# Patient Record
Sex: Female | Born: 1995 | Hispanic: No | Marital: Married | Smoking: Current every day smoker
Health system: Southern US, Community
[De-identification: ages and names within clinical notes are randomized; demographics above are authoritative.]

## PROBLEM LIST (undated history)

## (undated) DIAGNOSIS — G43909 Migraine, unspecified, not intractable, without status migrainosus: Secondary | ICD-10-CM

---

## 2021-02-26 ENCOUNTER — Inpatient Hospital Stay (HOSPITAL_COMMUNITY)
Admission: AD | Admit: 2021-02-26 | Discharge: 2021-02-26 | Disposition: A | Discharge status: Home / Self Care | Payer: Medicaid Other | Attending: Obstetrics & Gynecology | Admitting: Obstetrics & Gynecology

## 2021-02-26 ENCOUNTER — Other Ambulatory Visit: Payer: Self-pay

## 2021-02-26 ENCOUNTER — Encounter (HOSPITAL_COMMUNITY): Payer: Self-pay | Admitting: Obstetrics & Gynecology

## 2021-02-26 ENCOUNTER — Inpatient Hospital Stay (HOSPITAL_BASED_OUTPATIENT_CLINIC_OR_DEPARTMENT_OTHER): Payer: Medicaid Other

## 2021-02-26 DIAGNOSIS — O321XX0 Maternal care for breech presentation, not applicable or unspecified: Secondary | ICD-10-CM

## 2021-02-26 DIAGNOSIS — O99332 Smoking (tobacco) complicating pregnancy, second trimester: Secondary | ICD-10-CM | POA: Diagnosis not present

## 2021-02-26 DIAGNOSIS — R102 Pelvic and perineal pain: Secondary | ICD-10-CM | POA: Insufficient documentation

## 2021-02-26 DIAGNOSIS — Z3A17 17 weeks gestation of pregnancy: Secondary | ICD-10-CM | POA: Insufficient documentation

## 2021-02-26 DIAGNOSIS — N949 Unspecified condition associated with female genital organs and menstrual cycle: Secondary | ICD-10-CM

## 2021-02-26 DIAGNOSIS — O26892 Other specified pregnancy related conditions, second trimester: Secondary | ICD-10-CM | POA: Insufficient documentation

## 2021-02-26 LAB — WET PREP, GENITAL
Clue Cells Wet Prep HPF POC: NONE SEEN
Sperm: NONE SEEN
Trich, Wet Prep: NONE SEEN
Yeast Wet Prep HPF POC: NONE SEEN

## 2021-02-26 LAB — URINALYSIS, ROUTINE W REFLEX MICROSCOPIC
Bilirubin Urine: NEGATIVE
Glucose, UA: NEGATIVE mg/dL
Hgb urine dipstick: NEGATIVE
Ketones, ur: NEGATIVE mg/dL
Leukocytes,Ua: NEGATIVE
Nitrite: NEGATIVE
Protein, ur: NEGATIVE mg/dL
Specific Gravity, Urine: <1.005 — ABNORMAL LOW (ref 1.005–1.030)
pH: 7 (ref 5.0–8.0)

## 2021-02-26 NOTE — MAU Note (Signed)
Pt. Self swabbed, cultures sent to lab

## 2021-02-26 NOTE — MAU Note (Signed)
Presents stating she's [redacted] weeks pregnant arrived to [place] 2 days ago from [place] and since arrival has lower abdominal pain and vaginal pressure.  Denies VB.

## 2021-02-26 NOTE — MAU Provider Note (Signed)
History     CSN: [id number]  Arrival date and time: 02/26/21 1009   Event Date/Time   First Provider Initiated Contact with Patient 02/26/21 1338      Chief Complaint  Patient presents with   Abdominal Pain   Vaginal Pressure   25 y.o. G3P1011 @17 .0 wks presenting with pelvic and vaginal pain. Reports onset 2-3 days ago. Pain is worse when she is standing or sitting, gets better when lying down. Rates pain 8/10. Has not tried anything for it. Denies urinary sx. Denies vaginal itching and malodor but reports increased discharge. Denies VB. Also reports diarrhea and nausea since yesterday. She was getting care in 10/10 and moved here 3 days ago. Reports VB in first trimester, denies other complications.   OB History     Gravida  3   Para  1   Term  1   Preterm  0   AB  1   Living  1      SAB  1   IAB  0   Ectopic  0   Multiple  0   Live Births  1           No past medical history on file.  Past Surgical History:  Procedure Laterality Date   CESAREAN SECTION      History reviewed. No pertinent family history.  Social History   Tobacco Use   Smoking status: Some Days    Pack years: 0.00    Types: Cigarettes   Smokeless tobacco: Never  Substance Use Topics   Alcohol use: Never   Drug use: Never    Allergies: No Known Allergies  Medications Prior to Admission  Medication Sig Dispense Refill Last Dose   acetaminophen (TYLENOL) 500 MG tablet Take 1,000 mg by mouth every 6 (six) hours as needed.   02/26/2021 at 1300   Prenatal Vit-Fe Fumarate-FA (PRENATAL VITAMINS PO) Take by mouth.       Review of Systems  Gastrointestinal:  Positive for diarrhea and nausea. Negative for vomiting.  Genitourinary:  Positive for pelvic pain, vaginal discharge and vaginal pain. Negative for dysuria, frequency, urgency and vaginal bleeding.  Physical Exam   Blood pressure 107/74, pulse 79, temperature 98.3 F (36.8 C), temperature source Oral, resp. rate 18,  height 5' (1.524 m), weight 66.7 kg, last menstrual period 11/04/2020, SpO2 99 %.  Physical Exam Vitals and nursing note reviewed. Exam conducted with a chaperone present.  Constitutional:      General: She is not in acute distress.    Appearance: Normal appearance.  HENT:     Head: Normocephalic and atraumatic.  Cardiovascular:     Rate and Rhythm: Normal rate.  Pulmonary:     Effort: Pulmonary effort is normal. No respiratory distress.  Abdominal:     General: There is no distension.     Palpations: Abdomen is soft. There is no mass.     Tenderness: There is no abdominal tenderness. There is no guarding or rebound.     Hernia: No hernia is present.  Genitourinary:    Comments: VE: closed/thick Musculoskeletal:        General: Normal range of motion.     Cervical back: Normal range of motion.  Skin:    General: Skin is warm and dry.  Neurological:     General: No focal deficit present.     Mental Status: She is alert and oriented to person, place, and time.  Psychiatric:  Mood and Affect: Mood normal.        Behavior: Behavior normal.  FHT 141  Results for orders placed or performed during the hospital encounter of 02/26/21 (from the past 24 hour(s))  Urinalysis, Routine w reflex microscopic Urine, Clean Catch     Status: Abnormal   Collection Time: 02/26/21 11:14 AM  Result Value Ref Range   Color, Urine YELLOW YELLOW   APPearance CLEAR CLEAR   Specific Gravity, Urine <1.005 (L) 1.005 - 1.030   pH 7.0 5.0 - 8.0   Glucose, UA NEGATIVE NEGATIVE mg/dL   Hgb urine dipstick NEGATIVE NEGATIVE   Bilirubin Urine NEGATIVE NEGATIVE   Ketones, ur NEGATIVE NEGATIVE mg/dL   Protein, ur NEGATIVE NEGATIVE mg/dL   Nitrite NEGATIVE NEGATIVE   Leukocytes,Ua NEGATIVE NEGATIVE  Wet prep, genital     Status: Abnormal   Collection Time: 02/26/21 12:17 PM   Specimen: PATH Cytology Cervicovaginal Ancillary Only  Result Value Ref Range   Yeast Wet Prep HPF POC NONE SEEN NONE SEEN    Trich, Wet Prep NONE SEEN NONE SEEN   Clue Cells Wet Prep HPF POC NONE SEEN NONE SEEN   WBC, Wet Prep HPF POC MANY (A) NONE SEEN   Sperm NONE SEEN    MAU Course  Procedures  MDM Labs and [place] ordered and reviewed. Normal [place] including CL. Pain likely physiologic to pregnancy, discussed comfort measures. Stable for discharge home.   Assessment and Plan   1. [redacted] weeks gestation of pregnancy   2. Pelvic pain affecting pregnancy in second trimester, antepartum   3. Round ligament pain    Discharge home Follow up with OB provider of choice to start care- list provided SAB precautions  Allergies as of 02/26/2021   No Known Allergies      Medication List     TAKE these medications    acetaminophen 500 MG tablet Commonly known as: TYLENOL Take 1,000 mg by mouth every 6 (six) hours as needed.   PRENATAL VITAMINS PO Take by mouth.        Donette Larry, CNM 02/26/2021, 3:46 PM

## 2021-02-27 LAB — GC/CHLAMYDIA PROBE AMP (~~LOC~~) NOT AT ARMC
Chlamydia: NEGATIVE
Comment: NEGATIVE
Comment: NORMAL
Neisseria Gonorrhea: NEGATIVE

## 2021-03-20 ENCOUNTER — Encounter (HOSPITAL_COMMUNITY): Payer: Self-pay | Admitting: Obstetrics & Gynecology

## 2021-03-20 ENCOUNTER — Inpatient Hospital Stay (HOSPITAL_COMMUNITY)
Admission: AD | Admit: 2021-03-20 | Discharge: 2021-03-20 | Disposition: A | Discharge status: Home / Self Care | Payer: Medicaid Other | Attending: Obstetrics & Gynecology | Admitting: Obstetrics & Gynecology

## 2021-03-20 DIAGNOSIS — O219 Vomiting of pregnancy, unspecified: Secondary | ICD-10-CM | POA: Insufficient documentation

## 2021-03-20 DIAGNOSIS — O99282 Endocrine, nutritional and metabolic diseases complicating pregnancy, second trimester: Secondary | ICD-10-CM | POA: Diagnosis not present

## 2021-03-20 DIAGNOSIS — O99612 Diseases of the digestive system complicating pregnancy, second trimester: Secondary | ICD-10-CM | POA: Diagnosis not present

## 2021-03-20 DIAGNOSIS — F17210 Nicotine dependence, cigarettes, uncomplicated: Secondary | ICD-10-CM | POA: Insufficient documentation

## 2021-03-20 DIAGNOSIS — O99891 Other specified diseases and conditions complicating pregnancy: Secondary | ICD-10-CM

## 2021-03-20 DIAGNOSIS — R109 Unspecified abdominal pain: Secondary | ICD-10-CM | POA: Diagnosis not present

## 2021-03-20 DIAGNOSIS — K219 Gastro-esophageal reflux disease without esophagitis: Secondary | ICD-10-CM

## 2021-03-20 DIAGNOSIS — O99332 Smoking (tobacco) complicating pregnancy, second trimester: Secondary | ICD-10-CM | POA: Diagnosis not present

## 2021-03-20 DIAGNOSIS — R197 Diarrhea, unspecified: Secondary | ICD-10-CM | POA: Diagnosis not present

## 2021-03-20 DIAGNOSIS — E739 Lactose intolerance, unspecified: Secondary | ICD-10-CM | POA: Insufficient documentation

## 2021-03-20 DIAGNOSIS — Z3A20 20 weeks gestation of pregnancy: Secondary | ICD-10-CM | POA: Diagnosis not present

## 2021-03-20 DIAGNOSIS — O26892 Other specified pregnancy related conditions, second trimester: Secondary | ICD-10-CM | POA: Insufficient documentation

## 2021-03-20 DIAGNOSIS — R519 Headache, unspecified: Secondary | ICD-10-CM | POA: Diagnosis not present

## 2021-03-20 DIAGNOSIS — O26899 Other specified pregnancy related conditions, unspecified trimester: Secondary | ICD-10-CM

## 2021-03-20 LAB — URINALYSIS, ROUTINE W REFLEX MICROSCOPIC
Bilirubin Urine: NEGATIVE
Glucose, UA: NEGATIVE mg/dL
Hgb urine dipstick: NEGATIVE
Ketones, ur: NEGATIVE mg/dL
Leukocytes,Ua: NEGATIVE
Nitrite: NEGATIVE
Protein, ur: NEGATIVE mg/dL
Specific Gravity, Urine: 1.014 (ref 1.005–1.030)
pH: 7 (ref 5.0–8.0)

## 2021-03-20 MED ORDER — METOCLOPRAMIDE HCL 5 MG/ML IJ SOLN
10.0000 mg | Freq: Once | INTRAMUSCULAR | Status: AC
  Administered 2021-03-20: 10 mg via INTRAVENOUS
  Filled 2021-03-20: qty 2

## 2021-03-20 MED ORDER — LOPERAMIDE HCL 2 MG PO CAPS
2.0000 mg | ORAL_CAPSULE | Freq: Once | ORAL | Status: AC
  Administered 2021-03-20: 2 mg via ORAL
  Filled 2021-03-20: qty 1

## 2021-03-20 MED ORDER — ALUM & MAG HYDROXIDE-SIMETH 200-200-20 MG/5ML PO SUSP
30.0000 mL | Freq: Once | ORAL | Status: AC
  Administered 2021-03-20: 30 mL via ORAL
  Filled 2021-03-20: qty 30

## 2021-03-20 MED ORDER — DEXAMETHASONE SODIUM PHOSPHATE 10 MG/ML IJ SOLN
10.0000 mg | Freq: Once | INTRAMUSCULAR | Status: AC
  Administered 2021-03-20: 10 mg via INTRAVENOUS
  Filled 2021-03-20: qty 1

## 2021-03-20 MED ORDER — CALCIUM CARBONATE ANTACID 500 MG PO CHEW: 1.0000 | CHEWABLE_TABLET | Freq: Two times a day (BID) | ORAL | 0 refills | Status: AC

## 2021-03-20 MED ORDER — OMEPRAZOLE MAGNESIUM 20 MG PO TBEC: 20.0000 mg | DELAYED_RELEASE_TABLET | Freq: Every day | ORAL | 0 refills | Status: DC

## 2021-03-20 MED ORDER — DIPHENHYDRAMINE HCL 50 MG/ML IJ SOLN
25.0000 mg | Freq: Once | INTRAMUSCULAR | Status: AC
  Administered 2021-03-20: 25 mg via INTRAVENOUS
  Filled 2021-03-20: qty 1

## 2021-03-20 NOTE — MAU Note (Signed)
Pt reports she has had N/V/D and diarrhea that started last week has the N/VD for a day then it goes away for a few days and then it comes back and last for a full day and then goes away again .  N/V/D started again last night   Reports stomach ache and headache with the N/V/D. Took tylenol for headache and it has not helped.c/o reflux as well.

## 2021-03-20 NOTE — MAU Provider Note (Addendum)
History     CSN: [id number]  Arrival date and time: 03/20/21 1500   None     Chief Complaint  Patient presents with   Emesis   HPI  Kerra Jastrzebski is a 25 year old female G3P1011 with chief complaints of nausea, vomiting, and diarrhea. These are recurrent problems,  that she has had intermittently over the past 2 weeks. Reports daily heart burn and headache for the past week. Diarrhea 4-5 times in the last 24 hours. Patient states diarrhea episodes start with abdominal pain and urgency to defecate and end with rectal burning. Denies blood in stool. Last vomiting episode today at 5am and every 4-3 hours prior for since yesterday. States she has been able to keep down yogurt and some coffee. She states she thinks this is induced by heartburn and she describes her emesis as a small amount. She has been eating one meal per day for the past week due to heartburn. Tried taking famotidine 20mg  at time of heartburn onset and sleeping upright to try to relieve heartburn, both of these have been unsuccessful. Reports normal fetal movement. Denies chest pain, shortness of breath, vaginal bleeding/discharge, and uterine contractions.   Headache can be described as constant, is localized to the frontal and occipital lobes, and she currently rates pain 10/10. Took tylenol at 1pm today with no relief of pain. Sister had to drive patient to MAU today due to head pain. Denies vision changes. Reports history of migraine headaches in last pregnancy and was treated with "migraine pills" when she lived in 12/10.   Allergies to medicines: none Medications currently taking: prenatal vitamins, famotidine 20mg  (only at onset of heartburn), tylenol 1000mg  prn  Prenatal care: states she has a new OB appointment scheduled for April 09, 2021 [facility] [place]   OB History     Gravida  3   Para  1   Term  1   Preterm  0   AB  1   Living  1      SAB  1   IAB  0   Ectopic   0   Multiple  0   Live Births  1           No past medical history on file.  Past Surgical History:  Procedure Laterality Date   CESAREAN SECTION      No family history on file.  Social History   Tobacco Use   Smoking status: Some Days    Types: Cigarettes   Smokeless tobacco: Never  Substance Use Topics   Alcohol use: Never   Drug use: Never    Allergies: No Known Allergies  Medications Prior to Admission  Medication Sig Dispense Refill Last Dose   acetaminophen (TYLENOL) 500 MG tablet Take 1,000 mg by mouth every 6 (six) hours as needed.      Prenatal Vit-Fe Fumarate-FA (PRENATAL VITAMINS PO) Take by mouth.       Review of Systems  Constitutional:  Negative for fatigue and fever.  Respiratory:  Negative for chest tightness and shortness of breath.   Cardiovascular:  Negative for chest pain and palpitations.  Gastrointestinal:  Positive for abdominal pain, diarrhea, nausea and vomiting. Negative for blood in stool.       Reports heartburn   Genitourinary:  Negative for dysuria, urgency, vaginal bleeding and vaginal discharge.  Musculoskeletal:  Negative for back pain.  Neurological:  Negative for weakness.  Physical Exam   Blood pressure 117/70,  pulse 98, temperature 98.4 F (36.9 C), resp. rate 18, height 5' (1.524 m), weight 67.6 kg, last menstrual period 11/04/2020.  Physical Exam Vitals and nursing note reviewed.  Constitutional:      Appearance: She is normal weight.  Eyes:     Extraocular Movements: Extraocular movements intact.     Pupils: Pupils are equal, round, and reactive to light.  Cardiovascular:     Rate and Rhythm: Normal rate.  Pulmonary:     Effort: Pulmonary effort is normal.  Abdominal:     Palpations: Abdomen is soft.     Tenderness: There is abdominal tenderness in the epigastric area and periumbilical area.  Skin:    General: Skin is warm and dry.  Neurological:     Mental Status: She is alert and oriented to person,  place, and time.     Cranial Nerves: No dysarthria or facial asymmetry.     Sensory: Sensation is intact.     Motor: Motor function is intact. No weakness or pronator drift.     Coordination: Coordination is intact.  Psychiatric:        Mood and Affect: Mood normal.        Behavior: Behavior normal.    MAU Course  Procedures Results for orders placed or performed during the hospital encounter of 03/20/21 (from the past 24 hour(s))  Urinalysis, Routine w reflex microscopic Urine, Clean Catch     Status: None   Collection Time: 03/20/21  3:41 PM  Result Value Ref Range   Color, Urine YELLOW YELLOW   APPearance CLEAR CLEAR   Specific Gravity, Urine 1.014 1.005 - 1.030   pH 7.0 5.0 - 8.0   Glucose, UA NEGATIVE NEGATIVE mg/dL   Hgb urine dipstick NEGATIVE NEGATIVE   Bilirubin Urine NEGATIVE NEGATIVE   Ketones, ur NEGATIVE NEGATIVE mg/dL   Protein, ur NEGATIVE NEGATIVE mg/dL   Nitrite NEGATIVE NEGATIVE   Leukocytes,Ua NEGATIVE NEGATIVE   MDM Heartburn- GI cocktail and IV Pepcid Provided patient education on proper use of omeprazole 20mg  medication. Instructed patient to take 1 tablet 30-60 minutes prior to food intake in the morning once daily. Omeprazole is most effective when used daily and not as needed. Take TUMS prescription for relief of heartburn as needed every 4 hours or prior to meals. Instructed to avoid aggravating foods such as spicy foods, tomatoes, citrus fruits, chocolate, and caffeine.  Meds ordered this encounter  Medications   metoCLOPramide (REGLAN) injection 10 mg   diphenhydrAMINE (BENADRYL) injection 25 mg   dexamethasone (DECADRON) injection 10 mg   alum & mag hydroxide-simeth (MAALOX/MYLANTA) 200-200-20 MG/5ML suspension 30 mL   loperamide (IMODIUM) capsule 2 mg   omeprazole (PRILOSEC OTC) 20 MG tablet    Sig: Take 1 tablet (20 mg total) by mouth daily.    Dispense:  30 tablet    Refill:  0    Order Specific Question:   Supervising Provider    Answer:    A [[id number]]   calcium carbonate (TUMS) 500 MG chewable tablet    Sig: Chew 1 tablet (200 mg of elemental calcium total) by mouth 2 (two) times daily.    Dispense:  60 tablet    Refill:  0    Order Specific Question:   Supervising Provider    Answer:   Mariel Aloe [[id number]]    Assessment and Plan  --25 y.o. G3P1011 at [redacted]w[redacted]d  --FHT 147 by Doppler --HA resolved with IV cocktail --GERD and lactose intolerance in setting  of hx of lactose intolerance --Patient denies pain, other complaints prior to discharge --Discharge home in stable condition  Clayton Bibles, MSA, MSN, CNM Certified Nurse Midwife, Biochemist, clinical for [employer], [facility]

## 2021-03-27 ENCOUNTER — Inpatient Hospital Stay (HOSPITAL_COMMUNITY)
Admission: AD | Admit: 2021-03-27 | Discharge: 2021-03-28 | Disposition: A | Discharge status: Home / Self Care | Payer: Medicaid Other | Attending: Family Medicine | Admitting: Family Medicine

## 2021-03-27 ENCOUNTER — Encounter (HOSPITAL_COMMUNITY): Payer: Self-pay | Admitting: Family Medicine

## 2021-03-27 ENCOUNTER — Other Ambulatory Visit: Payer: Self-pay

## 2021-03-27 DIAGNOSIS — J069 Acute upper respiratory infection, unspecified: Secondary | ICD-10-CM | POA: Insufficient documentation

## 2021-03-27 DIAGNOSIS — Z20822 Contact with and (suspected) exposure to covid-19: Secondary | ICD-10-CM | POA: Insufficient documentation

## 2021-03-27 DIAGNOSIS — O99352 Diseases of the nervous system complicating pregnancy, second trimester: Secondary | ICD-10-CM | POA: Diagnosis not present

## 2021-03-27 DIAGNOSIS — O98512 Other viral diseases complicating pregnancy, second trimester: Secondary | ICD-10-CM | POA: Insufficient documentation

## 2021-03-27 DIAGNOSIS — Z3A21 21 weeks gestation of pregnancy: Secondary | ICD-10-CM | POA: Diagnosis not present

## 2021-03-27 DIAGNOSIS — G43909 Migraine, unspecified, not intractable, without status migrainosus: Secondary | ICD-10-CM | POA: Diagnosis not present

## 2021-03-27 DIAGNOSIS — Z79899 Other long term (current) drug therapy: Secondary | ICD-10-CM | POA: Diagnosis not present

## 2021-03-27 DIAGNOSIS — Z87891 Personal history of nicotine dependence: Secondary | ICD-10-CM | POA: Insufficient documentation

## 2021-03-27 DIAGNOSIS — Z0184 Encounter for antibody response examination: Secondary | ICD-10-CM

## 2021-03-27 DIAGNOSIS — O99512 Diseases of the respiratory system complicating pregnancy, second trimester: Secondary | ICD-10-CM | POA: Diagnosis not present

## 2021-03-27 HISTORY — DX: Migraine, unspecified, not intractable, without status migrainosus: G43.909

## 2021-03-27 NOTE — MAU Note (Signed)
Pt reports 2 days ago started w/ severe HA and body aches, feverish, and SOB. Took multiple doses of Tylenol and id not relieve pain. Denies VB and LOF and CTX.

## 2021-03-28 ENCOUNTER — Inpatient Hospital Stay (HOSPITAL_COMMUNITY): Payer: Medicaid Other

## 2021-03-28 DIAGNOSIS — J069 Acute upper respiratory infection, unspecified: Secondary | ICD-10-CM

## 2021-03-28 DIAGNOSIS — Z3A21 21 weeks gestation of pregnancy: Secondary | ICD-10-CM

## 2021-03-28 DIAGNOSIS — O99512 Diseases of the respiratory system complicating pregnancy, second trimester: Secondary | ICD-10-CM

## 2021-03-28 DIAGNOSIS — O99352 Diseases of the nervous system complicating pregnancy, second trimester: Secondary | ICD-10-CM | POA: Diagnosis not present

## 2021-03-28 DIAGNOSIS — G43909 Migraine, unspecified, not intractable, without status migrainosus: Secondary | ICD-10-CM

## 2021-03-28 LAB — RESP PANEL BY RT-PCR (FLU A&B, COVID) ARPGX2
Influenza A by PCR: NEGATIVE
Influenza B by PCR: NEGATIVE
SARS Coronavirus 2 by RT PCR: NEGATIVE

## 2021-03-28 MED ORDER — PSEUDOEPHEDRINE HCL 30 MG PO TABS
60.0000 mg | ORAL_TABLET | Freq: Once | ORAL | Status: AC
  Administered 2021-03-28: 60 mg via ORAL
  Filled 2021-03-28: qty 2

## 2021-03-28 MED ORDER — METOCLOPRAMIDE HCL 5 MG/ML IJ SOLN
10.0000 mg | Freq: Once | INTRAMUSCULAR | Status: AC
  Administered 2021-03-28: 10 mg via INTRAVENOUS
  Filled 2021-03-28: qty 2

## 2021-03-28 MED ORDER — LACTATED RINGERS IV BOLUS (SEPSIS)
1000.0000 mL | Freq: Once | INTRAVENOUS | Status: AC
  Administered 2021-03-28: 1000 mL via INTRAVENOUS

## 2021-03-28 MED ORDER — DEXAMETHASONE SODIUM PHOSPHATE 10 MG/ML IJ SOLN
10.0000 mg | Freq: Once | INTRAMUSCULAR | Status: AC
  Administered 2021-03-28: 10 mg via INTRAVENOUS
  Filled 2021-03-28: qty 1

## 2021-03-28 MED ORDER — DIPHENHYDRAMINE HCL 50 MG/ML IJ SOLN
25.0000 mg | Freq: Once | INTRAMUSCULAR | Status: AC
  Administered 2021-03-28: 25 mg via INTRAVENOUS
  Filled 2021-03-28: qty 1

## 2021-03-28 NOTE — Discharge Instructions (Signed)

## 2021-03-28 NOTE — MAU Provider Note (Signed)
Chief Complaint: Fever, Headache, and Back Pain   None     SUBJECTIVE HPI: Kristin Foster is a 25 y.o. G3P1011 at 6562w2d by LMP who presents to maternity admissions reporting fever, chills, body aches, congestion, and shortness of breath. She also reports h/a, worse 2-3 days ago when she could not get out of bed due to headache but still present today.  She denies sick contacts.  She reports SOB at rest, and needs to sit up and lean forward to breath better.  There are no other symptoms. She is taking paracetamol PO for pain, and took 500 mg 2 hours before arriving in MAU.     Location: headache, body aches Quality: aching Severity: 7/10 on pain scale Duration: 1 day  Timing: constant Modifying factors: not relieved by paracetamol/Tylenol Associated signs and symptoms: congestion, SOB  HPI  Past Medical History:  Diagnosis Date   Migraines    Past Surgical History:  Procedure Laterality Date   CESAREAN SECTION     Social History   Socioeconomic History   Marital status: Married    Spouse name: Not on file   Number of children: Not on file   Years of education: Not on file   Highest education level: Not on file  Occupational History   Not on file  Tobacco Use   Smoking status: Former   Smokeless tobacco: Never  Vaping Use   Vaping Use: Every day  Substance and Sexual Activity   Alcohol use: Never   Drug use: Never   Sexual activity: Yes  Other Topics Concern   Not on file  Social History Narrative   Not on file   Social Determinants of Health   Financial Resource Strain: Not on file  Food Insecurity: Not on file  Transportation Needs: Not on file  Physical Activity: Not on file  Stress: Not on file  Social Connections: Not on file  Intimate Partner Violence: Not on file   No current facility-administered medications on file prior to encounter.   Current Outpatient Medications on File Prior to Encounter  Medication Sig Dispense Refill   acetaminophen  (TYLENOL) 500 MG tablet Take 1,000 mg by mouth every 6 (six) hours as needed.     calcium carbonate (TUMS) 500 MG chewable tablet Chew 1 tablet (200 mg of elemental calcium total) by mouth 2 (two) times daily. 60 tablet 0   famotidine (PEPCID) 10 MG tablet Take 10 mg by mouth 2 (two) times daily.     omeprazole (PRILOSEC OTC) 20 MG tablet Take 1 tablet (20 mg total) by mouth daily. 30 tablet 0   Prenatal Vit-Fe Fumarate-FA (PRENATAL VITAMINS PO) Take by mouth.     No Known Allergies  ROS:  Review of Systems  Constitutional:  Positive for chills and fever. Negative for fatigue.  HENT:  Positive for congestion and rhinorrhea.   Respiratory:  Positive for shortness of breath. Negative for chest tightness.   Cardiovascular:  Negative for chest pain.  Genitourinary:  Negative for difficulty urinating, dysuria, flank pain, pelvic pain, vaginal bleeding, vaginal discharge and vaginal pain.  Neurological:  Positive for headaches. Negative for dizziness.  Psychiatric/Behavioral: Negative.      I have reviewed patient's Past Medical Hx, Surgical Hx, Family Hx, Social Hx, medications and allergies.   Physical Exam  Patient Vitals for the past 24 hrs:  BP Temp Temp src Pulse Resp SpO2  03/27/21 2257 109/67 98.2 F (36.8 C) Oral 94 18 99 %   Constitutional: Well-developed, well-nourished  female in no acute distress.  HEART: normal rate, heart sounds, regular rhythm RESP: normal effort, lung sounds clear and equal bilaterally  GI: Abd soft, non-tender. Pos BS x 4 MS: Extremities nontender, no edema, normal ROM Neurologic: Alert and oriented x 4.  GU: Neg CVAT.  PELVIC EXAM: Deferred  FHT 145 by doppler  LAB RESULTS Results for orders placed or performed during the hospital encounter of 03/27/21 (from the past 24 hour(s))  Resp Panel by RT-PCR (Flu A&B, Covid) Nasopharyngeal Swab     Status: None   Collection Time: 03/27/21 11:10 PM   Specimen: Nasopharyngeal Swab; Nasopharyngeal(NP)  swabs in vial transport medium  Result Value Ref Range   SARS Coronavirus 2 by RT PCR NEGATIVE NEGATIVE   Influenza A by PCR NEGATIVE NEGATIVE   Influenza B by PCR NEGATIVE NEGATIVE       IMAGING DG Chest Portable 1 View  Result Date: 03/28/2021 CLINICAL DATA:  Shortness of breath. EXAM: PORTABLE CHEST 1 VIEW COMPARISON:  None. FINDINGS: Decreased lung volumes are seen which is likely secondary to the degree of patient inspiration. There is no evidence of acute infiltrate, pleural effusion or pneumothorax. The cardiac silhouette is borderline in size. The visualized skeletal structures are unremarkable. IMPRESSION: Decreased lung volumes without acute cardiopulmonary disease. Electronically Signed   By: Aram Candela M.D.   On: 03/28/2021 00:38   [place] MFM OB LIMITED  Result Date: 02/26/2021 ----------------------------------------------------------------------  OBSTETRICS REPORT                       (Signed Final 02/26/2021 05:16 pm) ---------------------------------------------------------------------- Patient Info  ID #:       [id number]                          D.O.B.:  15-Jan-1996 (25 yrs)  Name:       Kristin Foster                   Visit Date: 02/26/2021 02:20 pm ---------------------------------------------------------------------- Performed By  Attending:        Noralee Space MD        Ref. Address:     [address]                                                             [place], [place]                                                             [address]  Performed By:     Marcellina Millin       [facility]  Referred By:      Lawernce Pitts CNM ---------------------------------------------------------------------- Orders  #  Description                           Code        Ordered By  1  [place] MFM OB LIMITED                      [phone]    MELANIE BHAMBRI ----------------------------------------------------------------------  #  Order #                     Accession #                Episode #  1  [id number]                   [id number]                 [id number] ---------------------------------------------------------------------- Indications  [redacted] weeks gestation of pregnancy                Z3A.17  Pelvic pain affecting pregnancy in second      O26.892  trimester ---------------------------------------------------------------------- Fetal Evaluation  Num Of Fetuses:         1  Fetal Heart Rate(bpm):  138  Cardiac Activity:       Observed  Presentation:           Breech  Placenta:               Posterior  Amniotic Fluid  AFI FV:      Within normal limits                              Largest Pocket(cm)                              5.36 ---------------------------------------------------------------------- Gestational Age  LMP:           16w 2d        Date:  11/04/20                 EDD:   08/11/21  Clinical EDD:  17w 3d                                        EDD:   08/03/21  Best:          17w 3d     Det. By:  Clinical EDD             EDD:   08/03/21 ---------------------------------------------------------------------- Anatomy  Diaphragm:             Appears normal         Kidneys:                Appear normal  Stomach:  Appears normal, left   Bladder:                Appears normal                         sided ---------------------------------------------------------------------- Cervix Uterus Adnexa  Cervix  Length:            3.7  cm.  Normal appearance by transabdominal scan.  Uterus  No abnormality visualized.  Right Ovary  No adnexal mass visualized.  Left Ovary  No adnexal mass visualized.  Cul De Sac  No free fluid seen.  Adnexa  No adnexal mass visualized. ---------------------------------------------------------------------- Impression  Patient is being evaluated at the MAU for complaints of pelvic  pain.   A limited ultrasound study was performed.  Amniotic fluid is normal good fetal activity seen.  On transabdominal scan, the cervix looks long and closed. ----------------------------------------------------------------------                  Noralee Space, MD Electronically Signed Final Report   02/26/2021 05:16 pm ----------------------------------------------------------------------   MAU Management/MDM: Orders Placed This Encounter  Procedures   Resp Panel by RT-PCR (Flu A&B, Covid) Nasopharyngeal Swab   DG Chest Portable 1 View   Airborne and Contact precautions   Discharge patient    Meds ordered this encounter  Medications   FOLLOWED BY Linked Order Group    lactated ringers bolus 1,000 mL    diphenhydrAMINE (BENADRYL) injection 25 mg    metoCLOPramide (REGLAN) injection 10 mg    dexamethasone (DECADRON) injection 10 mg   pseudoephedrine (SUDAFED) tablet 60 mg    Pt with improved symptoms following IV fluids/medications.  Chest X-ray without evidence of pneumonia.  COVID and flu swab negative.  Migraine with URI, pt to take OTC medications at home and f/u with [facility] provider. Consider referral for headaches if persist during pregnancy.  Return to MAU as needed for worsening symptoms or emergencies.    ASSESSMENT 1. Migraine without status migrainosus, not intractable, unspecified migraine type   2. Viral URI   3. COVID-19 virus antibody negative   4. [redacted] weeks gestation of pregnancy     PLAN Discharge home Allergies as of 03/28/2021   No Known Allergies      Medication List     TAKE these medications    acetaminophen 500 MG tablet Commonly known as: TYLENOL Take 1,000 mg by mouth every 6 (six) hours as needed.   calcium carbonate 500 MG chewable tablet Commonly known as: Tums Chew 1 tablet (200 mg of elemental calcium total) by mouth 2 (two) times daily.   famotidine 10 MG tablet Commonly known as: PEPCID Take 10 mg by mouth 2 (two) times daily.   omeprazole 20 MG  tablet Commonly known as: PriLOSEC OTC Take 1 tablet (20 mg total) by mouth daily.   PRENATAL VITAMINS PO Take by mouth.         Sharen Counter Certified Nurse-Midwife 03/28/2021  1:33 AM

## 2021-04-09 ENCOUNTER — Inpatient Hospital Stay (HOSPITAL_COMMUNITY)
Admission: AD | Admit: 2021-04-09 | Discharge: 2021-04-09 | Disposition: A | Discharge status: Home / Self Care | Payer: Medicaid Other | Attending: Obstetrics & Gynecology | Admitting: Obstetrics & Gynecology

## 2021-04-09 ENCOUNTER — Encounter (HOSPITAL_COMMUNITY): Payer: Self-pay | Admitting: Obstetrics & Gynecology

## 2021-04-09 ENCOUNTER — Other Ambulatory Visit: Payer: Self-pay

## 2021-04-09 DIAGNOSIS — Z3A23 23 weeks gestation of pregnancy: Secondary | ICD-10-CM | POA: Diagnosis not present

## 2021-04-09 DIAGNOSIS — K219 Gastro-esophageal reflux disease without esophagitis: Secondary | ICD-10-CM

## 2021-04-09 DIAGNOSIS — O212 Late vomiting of pregnancy: Secondary | ICD-10-CM | POA: Diagnosis not present

## 2021-04-09 DIAGNOSIS — O26892 Other specified pregnancy related conditions, second trimester: Secondary | ICD-10-CM

## 2021-04-09 DIAGNOSIS — R109 Unspecified abdominal pain: Secondary | ICD-10-CM

## 2021-04-09 DIAGNOSIS — O219 Vomiting of pregnancy, unspecified: Secondary | ICD-10-CM

## 2021-04-09 LAB — URINALYSIS, ROUTINE W REFLEX MICROSCOPIC
Bilirubin Urine: NEGATIVE
Glucose, UA: NEGATIVE mg/dL
Hgb urine dipstick: NEGATIVE
Ketones, ur: NEGATIVE mg/dL
Leukocytes,Ua: NEGATIVE
Nitrite: NEGATIVE
Protein, ur: NEGATIVE mg/dL
Specific Gravity, Urine: 1.025 (ref 1.005–1.030)
pH: 6.5 (ref 5.0–8.0)

## 2021-04-09 MED ORDER — ONDANSETRON 4 MG PO TBDP
4.0000 mg | ORAL_TABLET | Freq: Once | ORAL | Status: AC
  Administered 2021-04-09: 4 mg via ORAL
  Filled 2021-04-09: qty 1

## 2021-04-09 MED ORDER — ALUM & MAG HYDROXIDE-SIMETH 200-200-20 MG/5ML PO SUSP
30.0000 mL | Freq: Once | ORAL | Status: AC
  Administered 2021-04-09: 30 mL via ORAL
  Filled 2021-04-09: qty 30

## 2021-04-09 MED ORDER — PROMETHAZINE HCL 12.5 MG PO TABS: 12.5000 mg | ORAL_TABLET | Freq: Four times a day (QID) | ORAL | 0 refills | Status: DC | PRN

## 2021-04-09 NOTE — MAU Note (Signed)
Kristin Foster is a 25 y.o. at [redacted]w[redacted]d here in MAU reporting: yesterday had some cramping and 4 episodes of vomiting. Noticed some blood in her vomit. Vomiting has subsided but is still cramping. Cramping is intermittent. No vaginal bleeding or LOF. +FM  Onset of complaint: yesterday  Pain score: 8/10  Vitals:   04/09/21 0942  BP: 99/61  Pulse: 91  Resp: 16  Temp: 98.4 F (36.9 C)  SpO2: 98%     FHT: +FM, pt wearing form fitted dress  Lab orders placed from triage: UA

## 2021-04-09 NOTE — Progress Notes (Signed)
Obix down. Unable to document efm tracings.

## 2021-04-09 NOTE — MAU Provider Note (Signed)
History     CSN: [id number]  Arrival date and time: 04/09/21 4259   Event Date/Time   First Provider Initiated Contact with Patient 04/09/21 1109      Chief Complaint  Patient presents with   Abdominal Pain   HPI Kristin Foster is a 25 y.o. G3P1011 at [redacted]w[redacted]d who presents to MAU with chief complaint of vomiting. This is a recurrent problem. Patient also reports occasional strands of blood within her emesis. Patient states she became concerned when she visualized "whole solid food" in her emesis earlier today. She states she vomits up to 6 times per day but also has 2-3 days in a row without any vomiting episodes.  She manages with acid reflux medicine but does not typically take antiemetics. She endorses a trial of dairy free diet but states she completed this and did not experience an improvement in her symptoms. 24 hours diet recall includes alfredo pasta and mocha coffee drink.  She denies dysuria, constipation, weakness, syncope.   OB History     Gravida  3   Para  1   Term  1   Preterm  0   AB  1   Living  1      SAB  1   IAB  0   Ectopic  0   Multiple  0   Live Births  1           Past Medical History:  Diagnosis Date   Migraines     Past Surgical History:  Procedure Laterality Date   CESAREAN SECTION      History reviewed. No pertinent family history.  Social History   Tobacco Use   Smoking status: Former   Smokeless tobacco: Never  Building services engineer Use: Every day  Substance Use Topics   Alcohol use: Never   Drug use: Never    Allergies: No Known Allergies  Medications Prior to Admission  Medication Sig Dispense Refill Last Dose   famotidine (PEPCID) 10 MG tablet Take 10 mg by mouth 2 (two) times daily.   04/08/2021   Prenatal Vit-Fe Fumarate-FA (PRENATAL VITAMINS PO) Take by mouth.   04/09/2021   acetaminophen (TYLENOL) 500 MG tablet Take 1,000 mg by mouth every 6 (six) hours as needed.      calcium carbonate (TUMS) 500 MG  chewable tablet Chew 1 tablet (200 mg of elemental calcium total) by mouth 2 (two) times daily. 60 tablet 0    omeprazole (PRILOSEC OTC) 20 MG tablet Take 1 tablet (20 mg total) by mouth daily. 30 tablet 0     Review of Systems  Gastrointestinal:  Positive for nausea and vomiting.  All other systems reviewed and are negative. Physical Exam   Blood pressure 99/60, pulse 87, temperature 98.4 F (36.9 C), temperature source Oral, resp. rate 16, height 5' (1.524 m), weight 69.9 kg, last menstrual period 11/04/2020, SpO2 98 %.  Physical Exam Vitals and nursing note reviewed. Exam conducted with a chaperone present.  Constitutional:      Appearance: She is well-developed.  Cardiovascular:     Rate and Rhythm: Normal rate and regular rhythm.     Heart sounds: Normal heart sounds.  Pulmonary:     Effort: Pulmonary effort is normal.     Breath sounds: Normal breath sounds.  Abdominal:     General: Bowel sounds are normal.     Palpations: Abdomen is soft.     Tenderness: There is no abdominal tenderness.  Skin:  General: Skin is warm and dry.     Capillary Refill: Capillary refill takes less than 2 seconds.  Neurological:     Mental Status: She is alert and oriented to person, place, and time.    MAU Course  Procedures  --Patient evaluated in MAU for identical complaint on 07/21 and 07/29.  --Weight today 69.9kg, compared to 67.6kg on 07/21.  --No ketonuria noted on UA --Patient tolerating strong flavored and cream-based foods --Advised refining diet, premedicating with antiemetics, small frequent grazing  Patient Vitals for the past 24 hrs:  BP Temp Temp src Pulse Resp SpO2 Height Weight  04/09/21 1207 106/63 97.9 F (36.6 C) Oral 78 16 100 % -- --  04/09/21 0958 99/60 -- -- 87 -- -- -- --  04/09/21 0942 99/61 98.4 F (36.9 C) Oral 91 16 98 % -- --  04/09/21 0938 -- -- -- -- -- -- 5' (1.524 m) 69.9 kg   Results for orders placed or performed during the hospital encounter  of 04/09/21 (from the past 24 hour(s))  Urinalysis, Routine w reflex microscopic Urine, Clean Catch     Status: None   Collection Time: 04/09/21  9:43 AM  Result Value Ref Range   Color, Urine YELLOW YELLOW   APPearance CLEAR CLEAR   Specific Gravity, Urine 1.025 1.005 - 1.030   pH 6.5 5.0 - 8.0   Glucose, UA NEGATIVE NEGATIVE mg/dL   Hgb urine dipstick NEGATIVE NEGATIVE   Bilirubin Urine NEGATIVE NEGATIVE   Ketones, ur NEGATIVE NEGATIVE mg/dL   Protein, ur NEGATIVE NEGATIVE mg/dL   Nitrite NEGATIVE NEGATIVE   Leukocytes,Ua NEGATIVE NEGATIVE   Meds ordered this encounter  Medications   ondansetron (ZOFRAN-ODT) disintegrating tablet 4 mg   alum & mag hydroxide-simeth (MAALOX/MYLANTA) 200-200-20 MG/5ML suspension 30 mL   promethazine (PHENERGAN) 12.5 MG tablet    Sig: Take 1 tablet (12.5 mg total) by mouth every 6 (six) hours as needed for nausea or vomiting.    Dispense:  30 tablet    Refill:  0    Order Specific Question:   Supervising Provider    Answer:   Jaynie Collins A [[id number]]   Assessment and Plan  --25 y.o. G3P1011 at [redacted]w[redacted]d  --FHT 140, reassuring on EFM --Intermittent vomiting without ketonuria --Reassuring weight gain over time --Tolerating PO s/p medication in MAU, outpatient rx --Discharge home in stable condition  Calvert Cantor, CNM 04/09/2021, 3:12 PM

## 2021-05-03 ENCOUNTER — Inpatient Hospital Stay (HOSPITAL_COMMUNITY)
Admission: AD | Admit: 2021-05-03 | Discharge: 2021-05-03 | Disposition: A | Discharge status: Home / Self Care | Payer: Medicaid Other | Attending: Obstetrics and Gynecology | Admitting: Obstetrics and Gynecology

## 2021-05-03 ENCOUNTER — Other Ambulatory Visit: Payer: Self-pay

## 2021-05-03 ENCOUNTER — Encounter (HOSPITAL_COMMUNITY): Payer: Self-pay | Admitting: Obstetrics and Gynecology

## 2021-05-03 DIAGNOSIS — R109 Unspecified abdominal pain: Secondary | ICD-10-CM | POA: Insufficient documentation

## 2021-05-03 DIAGNOSIS — Z3A26 26 weeks gestation of pregnancy: Secondary | ICD-10-CM

## 2021-05-03 DIAGNOSIS — R519 Headache, unspecified: Secondary | ICD-10-CM | POA: Diagnosis not present

## 2021-05-03 DIAGNOSIS — N9489 Other specified conditions associated with female genital organs and menstrual cycle: Secondary | ICD-10-CM

## 2021-05-03 DIAGNOSIS — R197 Diarrhea, unspecified: Secondary | ICD-10-CM

## 2021-05-03 DIAGNOSIS — O26892 Other specified pregnancy related conditions, second trimester: Secondary | ICD-10-CM | POA: Diagnosis present

## 2021-05-03 DIAGNOSIS — N949 Unspecified condition associated with female genital organs and menstrual cycle: Secondary | ICD-10-CM | POA: Diagnosis not present

## 2021-05-03 DIAGNOSIS — Z87891 Personal history of nicotine dependence: Secondary | ICD-10-CM | POA: Insufficient documentation

## 2021-05-03 LAB — URINALYSIS, ROUTINE W REFLEX MICROSCOPIC
Bilirubin Urine: NEGATIVE
Glucose, UA: NEGATIVE mg/dL
Hgb urine dipstick: NEGATIVE
Ketones, ur: 20 mg/dL — AB
Leukocytes,Ua: NEGATIVE
Nitrite: NEGATIVE
Protein, ur: NEGATIVE mg/dL
Specific Gravity, Urine: 1.012 (ref 1.005–1.030)
pH: 7 (ref 5.0–8.0)

## 2021-05-03 LAB — FETAL FIBRONECTIN: Fetal Fibronectin: NEGATIVE

## 2021-05-03 MED ORDER — METOCLOPRAMIDE HCL 5 MG/ML IJ SOLN
10.0000 mg | Freq: Once | INTRAMUSCULAR | Status: AC
  Administered 2021-05-03: 10 mg via INTRAVENOUS
  Filled 2021-05-03: qty 2

## 2021-05-03 MED ORDER — DIPHENHYDRAMINE HCL 50 MG/ML IJ SOLN
25.0000 mg | Freq: Once | INTRAMUSCULAR | Status: AC
  Administered 2021-05-03: 25 mg via INTRAVENOUS
  Filled 2021-05-03: qty 1

## 2021-05-03 MED ORDER — DIPHENHYDRAMINE HCL 25 MG PO CAPS
25.0000 mg | ORAL_CAPSULE | Freq: Once | ORAL | Status: DC
  Filled 2021-05-03: qty 1

## 2021-05-03 MED ORDER — LACTATED RINGERS IV BOLUS
1000.0000 mL | Freq: Once | INTRAVENOUS | Status: AC
  Administered 2021-05-03: 1000 mL via INTRAVENOUS

## 2021-05-03 MED ORDER — DEXAMETHASONE SODIUM PHOSPHATE 10 MG/ML IJ SOLN
10.0000 mg | Freq: Once | INTRAMUSCULAR | Status: AC
  Administered 2021-05-03: 10 mg via INTRAVENOUS
  Filled 2021-05-03: qty 1

## 2021-05-03 NOTE — MAU Provider Note (Addendum)
Event Date/Time   First Provider Initiated Contact with Patient 05/03/21 1347      Chief Complaint:  Abdominal Pain   Kristin Foster is  25 y.o. G3P1011 at [redacted]w[redacted]d presents complaining of Abdominal Pain .  Began having constant pressure in vagina that started yesterday.  Movement makes it worse.  Has also gotten a HA (frontal) that Tylenol helps (she has also had a HA cocktail in the past that helped).  However, the TYlenonl is wearing off so is amenable to a HA cocktail (also has some ketones in urine)/IVF. Also has been having "straight water" diarrhea for several weeks, despite changing her diet.   Obstetrical/Gynecological History: OB History     Gravida  3   Para  1   Term  1   Preterm  0   AB  1   Living  1      SAB  1   IAB  0   Ectopic  0   Multiple  0   Live Births  1          Past Medical History: Past Medical History:  Diagnosis Date   Migraines     Past Surgical History: Past Surgical History:  Procedure Laterality Date   CESAREAN SECTION      Family History: History reviewed. No pertinent family history.  Social History: Social History   Tobacco Use   Smoking status: Former   Smokeless tobacco: Never  Building services engineer Use: Every day  Substance Use Topics   Alcohol use: Never   Drug use: Never    Allergies: No Known Allergies  Meds:  No medications prior to admission.    Review of Systems   Constitutional: Negative for fever and chills Eyes: Negative for visual disturbances Respiratory: Negative for shortness of breath, dyspnea Cardiovascular: Negative for chest pain or palpitations  Gastrointestinal: Negative for vomiting, diarrhea and constipation Genitourinary: Negative for dysuria and urgency Musculoskeletal: Negative for back pain, joint pain, myalgias.  Normal ROM  Neurological: Negative for dizziness and headaches    Physical Exam  Blood pressure 97/64, pulse 69, temperature 98.4 F (36.9 C), temperature  source Oral, resp. rate 15, last menstrual period 11/04/2020, SpO2 100 %. GENERAL: Well-developed, well-nourished female in no acute distress.  LUNGS: Normal respiratory effort HEART: Regular rate and rhythm. ABDOMEN: Soft, nontender, nondistended, gravid.  EXTREMITIES: Nontender, no edema, 2+ distal pulses. DTR's 2+ CERVICAL EXAM: Dilatation Inner os closed, outer os FT   Effacement 0%   Station ballottable   Presentation: unsure FHT:  Baseline rate 150 bpm   Variability moderate  Accelerations present   Decelerations none.  Reassuring for GA Contractions: Every 0 mins   Labs: Results for orders placed or performed during the hospital encounter of 05/03/21 (from the past 24 hour(s))  Urinalysis, Routine w reflex microscopic Urine, Clean Catch   Collection Time: 05/03/21 12:28 PM  Result Value Ref Range   Color, Urine YELLOW YELLOW   APPearance HAZY (A) CLEAR   Specific Gravity, Urine 1.012 1.005 - 1.030   pH 7.0 5.0 - 8.0   Glucose, UA NEGATIVE NEGATIVE mg/dL   Hgb urine dipstick NEGATIVE NEGATIVE   Bilirubin Urine NEGATIVE NEGATIVE   Ketones, ur 20 (A) NEGATIVE mg/dL   Protein, ur NEGATIVE NEGATIVE mg/dL   Nitrite NEGATIVE NEGATIVE   Leukocytes,Ua NEGATIVE NEGATIVE  Fetal fibronectin   Collection Time: 05/03/21  1:31 PM  Result Value Ref Range   Fetal Fibronectin NEGATIVE NEGATIVE   Imaging Studies:  No results found.  Assessment: Kristin Foster is  25 y.o. G3P1011 at [redacted]w[redacted]d presents with HA (resolved after cocktail) and vaginal pressure (not resolved). Diarrhea  Plan: Try a maternity belt Outpt Stool culture (return specimen to MAU, discussed w/lab:  order and collection cup sent with pt)  [name] 9/3/20225:21 PM

## 2021-05-03 NOTE — MAU Note (Addendum)
Pt reports to mau with c/o lower abd pressure that started yesterday. Pt reports the pain is constant and increases with movement. Denies LOF or vag bleeding

## 2021-05-09 ENCOUNTER — Inpatient Hospital Stay (HOSPITAL_COMMUNITY)
Admission: AD | Admit: 2021-05-09 | Discharge: 2021-05-09 | Disposition: A | Discharge status: Home / Self Care | Payer: Medicaid Other | Attending: Obstetrics & Gynecology | Admitting: Obstetrics & Gynecology

## 2021-05-09 ENCOUNTER — Other Ambulatory Visit: Payer: Self-pay

## 2021-05-09 ENCOUNTER — Encounter (HOSPITAL_COMMUNITY): Payer: Self-pay | Admitting: Obstetrics & Gynecology

## 2021-05-09 DIAGNOSIS — O26892 Other specified pregnancy related conditions, second trimester: Secondary | ICD-10-CM | POA: Insufficient documentation

## 2021-05-09 DIAGNOSIS — O4702 False labor before 37 completed weeks of gestation, second trimester: Secondary | ICD-10-CM

## 2021-05-09 DIAGNOSIS — Z3A27 27 weeks gestation of pregnancy: Secondary | ICD-10-CM | POA: Insufficient documentation

## 2021-05-09 DIAGNOSIS — R519 Headache, unspecified: Secondary | ICD-10-CM | POA: Insufficient documentation

## 2021-05-09 DIAGNOSIS — R102 Pelvic and perineal pain: Secondary | ICD-10-CM | POA: Diagnosis not present

## 2021-05-09 LAB — URINALYSIS, ROUTINE W REFLEX MICROSCOPIC
Bilirubin Urine: NEGATIVE
Glucose, UA: NEGATIVE mg/dL
Hgb urine dipstick: NEGATIVE
Ketones, ur: NEGATIVE mg/dL
Leukocytes,Ua: NEGATIVE
Nitrite: NEGATIVE
Protein, ur: NEGATIVE mg/dL
Specific Gravity, Urine: 1.02 (ref 1.005–1.030)
pH: 7 (ref 5.0–8.0)

## 2021-05-09 LAB — WET PREP, GENITAL
Clue Cells Wet Prep HPF POC: NONE SEEN
Sperm: NONE SEEN
Trich, Wet Prep: NONE SEEN
Yeast Wet Prep HPF POC: NONE SEEN

## 2021-05-09 MED ORDER — CYCLOBENZAPRINE HCL 5 MG PO TABS
10.0000 mg | ORAL_TABLET | Freq: Once | ORAL | Status: AC
  Administered 2021-05-09: 10 mg via ORAL
  Filled 2021-05-09: qty 2

## 2021-05-09 MED ORDER — BUTALBITAL-APAP-CAFFEINE 50-325-40 MG PO TABS
1.0000 | ORAL_TABLET | Freq: Once | ORAL | Status: AC
  Administered 2021-05-09: 1 via ORAL
  Filled 2021-05-09: qty 1

## 2021-05-09 MED ORDER — CYCLOBENZAPRINE HCL 10 MG PO TABS: 10.0000 mg | ORAL_TABLET | Freq: Two times a day (BID) | ORAL | 0 refills | Status: DC | PRN

## 2021-05-09 MED ORDER — ACETAMINOPHEN 325 MG PO TABS
325.0000 mg | ORAL_TABLET | Freq: Once | ORAL | Status: AC
  Administered 2021-05-09: 325 mg via ORAL
  Filled 2021-05-09: qty 1

## 2021-05-09 NOTE — MAU Provider Note (Addendum)
History     CSN: [id number]  Arrival date and time: 05/09/21 1805   Event Date/Time   First Provider Initiated Contact with Patient 05/09/21 1945      Chief Complaint  Patient presents with   Contractions   Vaginal Pain   Headache   25 y.o. G3P1011 @27 .2 wks presenting with ctx and vaginal pain. Reports onset of ctx 6 days ago. Reports increased frequency since yesterday. Unsure of frequency. Denies VB or LOF. Feels constant, sharp vaginal pain. Rates pain 5/10. Denies vaginal itching, discharge, or malodor. Endorses frontal HA that started around 7am. Rates pain 10/10. Denies visual disturbances but reports dizziness. She took Tylenol twice but it didn't help. Report good FM.   OB History     Gravida  3   Para  1   Term  1   Preterm  0   AB  1   Living  1      SAB  1   IAB  0   Ectopic  0   Multiple  0   Live Births  1           Past Medical History:  Diagnosis Date   Migraines     Past Surgical History:  Procedure Laterality Date   CESAREAN SECTION      History reviewed. No pertinent family history.  Social History   Tobacco Use   Smoking status: Former   Smokeless tobacco: Never  12/10 Use: Every day   Substances: Nicotine  Substance Use Topics   Alcohol use: Never   Drug use: Never    Allergies: No Known Allergies  Medications Prior to Admission  Medication Sig Dispense Refill Last Dose   acetaminophen (TYLENOL) 500 MG tablet Take 1,000 mg by mouth every 6 (six) hours as needed.   05/09/2021   famotidine (PEPCID) 10 MG tablet Take 10 mg by mouth 2 (two) times daily.   Past Week   Prenatal Vit-Fe Fumarate-FA (PRENATAL VITAMINS PO) Take by mouth.   05/09/2021   omeprazole (PRILOSEC OTC) 20 MG tablet Take 1 tablet (20 mg total) by mouth daily. 30 tablet 0    promethazine (PHENERGAN) 12.5 MG tablet Take 1 tablet (12.5 mg total) by mouth every 6 (six) hours as needed for nausea or vomiting. 30 tablet 0     Review of Systems   Gastrointestinal:  Positive for abdominal pain (ctx).  Genitourinary:  Positive for vaginal pain. Negative for dysuria, frequency, hematuria, urgency, vaginal bleeding and vaginal discharge.  Physical Exam   Blood pressure 99/61, pulse 87, temperature 98.9 F (37.2 C), temperature source Oral, resp. rate 18, last menstrual period 11/04/2020, SpO2 99 %.  Physical Exam Vitals and nursing note reviewed. Exam conducted with a chaperone present.  Constitutional:      General: She is not in acute distress.    Appearance: Normal appearance.  HENT:     Head: Normocephalic and atraumatic.  Cardiovascular:     Rate and Rhythm: Normal rate.  Pulmonary:     Effort: Pulmonary effort is normal. No respiratory distress.  Abdominal:     Palpations: Abdomen is soft.     Tenderness: There is no abdominal tenderness.     Comments: gravid  Genitourinary:    Comments: VE: closed/thick Musculoskeletal:        General: Normal range of motion.     Cervical back: Normal range of motion.  Skin:    General: Skin is warm and dry.  Neurological:  General: No focal deficit present.     Mental Status: She is alert.  Psychiatric:        Mood and Affect: Mood normal.        Behavior: Behavior normal.  EFM: 140 bpm, mod variability, + accels, no decels Toco: none  Results for orders placed or performed during the hospital encounter of 05/09/21 (from the past 24 hour(s))  Urinalysis, Routine w reflex microscopic Urine, Clean Catch     Status: None   Collection Time: 05/09/21  6:44 PM  Result Value Ref Range   Color, Urine YELLOW YELLOW   APPearance CLEAR CLEAR   Specific Gravity, Urine 1.020 1.005 - 1.030   pH 7.0 5.0 - 8.0   Glucose, UA NEGATIVE NEGATIVE mg/dL   Hgb urine dipstick NEGATIVE NEGATIVE   Bilirubin Urine NEGATIVE NEGATIVE   Ketones, ur NEGATIVE NEGATIVE mg/dL   Protein, ur NEGATIVE NEGATIVE mg/dL   Nitrite NEGATIVE NEGATIVE   Leukocytes,Ua NEGATIVE NEGATIVE   MAU Course   Procedures Fioricet  MDM Labs ordered. Transfer of care given to [place], [place]  05/09/2021 8:48 PM    Assessment and Plan  Reassessment (9:40 PM)  Patient reports no improvement. States she had HA cocktail with improvement during previous visits. Patient only received one Fioricet Will give dose of flexeril and tylenol 325.  Reassessment (10:04 PM) -Patient reports HA has improved. -Discussed not receiving flexeril and tylenol, but patient desires. -Also requests and given script for flexeril. -Instructed to follow up in office as scheduled.  -Encouraged to call primary office or return to MAU if symptoms worsen or with the onset of new symptoms. -Discharged to home in stable condition.  Cherre Robins MSN, CNM Advanced Practice Provider, [facility]

## 2021-05-09 NOTE — MAU Note (Signed)
Constant vaginal pain. Has periods of contractions off and on since last here. No bleeding or leaking.  Has a really bad HA, took Tylenol- no relief.

## 2021-05-12 LAB — GC/CHLAMYDIA PROBE AMP (~~LOC~~) NOT AT ARMC
Chlamydia: NEGATIVE
Comment: NEGATIVE
Comment: NORMAL
Neisseria Gonorrhea: NEGATIVE

## 2021-05-16 ENCOUNTER — Inpatient Hospital Stay (HOSPITAL_COMMUNITY)
Admission: AD | Admit: 2021-05-16 | Discharge: 2021-05-17 | Disposition: A | Discharge status: Home / Self Care | Payer: Medicaid Other | Attending: Obstetrics and Gynecology | Admitting: Obstetrics and Gynecology

## 2021-05-16 ENCOUNTER — Other Ambulatory Visit: Payer: Self-pay

## 2021-05-16 ENCOUNTER — Encounter (HOSPITAL_COMMUNITY): Payer: Self-pay | Admitting: Obstetrics and Gynecology

## 2021-05-16 DIAGNOSIS — Z3493 Encounter for supervision of normal pregnancy, unspecified, third trimester: Secondary | ICD-10-CM

## 2021-05-16 DIAGNOSIS — F17290 Nicotine dependence, other tobacco product, uncomplicated: Secondary | ICD-10-CM | POA: Insufficient documentation

## 2021-05-16 DIAGNOSIS — Z3A28 28 weeks gestation of pregnancy: Secondary | ICD-10-CM

## 2021-05-16 DIAGNOSIS — O368130 Decreased fetal movements, third trimester, not applicable or unspecified: Secondary | ICD-10-CM | POA: Insufficient documentation

## 2021-05-16 DIAGNOSIS — Z79899 Other long term (current) drug therapy: Secondary | ICD-10-CM | POA: Insufficient documentation

## 2021-05-16 DIAGNOSIS — O368120 Decreased fetal movements, second trimester, not applicable or unspecified: Secondary | ICD-10-CM

## 2021-05-16 NOTE — MAU Note (Signed)
Pt reports she has not felt baby move since yesterday. Denies any vag bleeding or leaking or cramping.

## 2021-05-17 DIAGNOSIS — O368130 Decreased fetal movements, third trimester, not applicable or unspecified: Secondary | ICD-10-CM

## 2021-05-17 DIAGNOSIS — F17290 Nicotine dependence, other tobacco product, uncomplicated: Secondary | ICD-10-CM | POA: Diagnosis not present

## 2021-05-17 DIAGNOSIS — Z3A28 28 weeks gestation of pregnancy: Secondary | ICD-10-CM

## 2021-05-17 DIAGNOSIS — Z79899 Other long term (current) drug therapy: Secondary | ICD-10-CM | POA: Diagnosis not present

## 2021-05-17 DIAGNOSIS — Z3493 Encounter for supervision of normal pregnancy, unspecified, third trimester: Secondary | ICD-10-CM | POA: Diagnosis not present

## 2021-05-17 NOTE — MAU Provider Note (Signed)
History     CSN: [id number]  Arrival date and time: 05/16/21 2331   Event Date/Time   First Provider Initiated Contact with Patient 05/17/21 0029      Chief Complaint  Patient presents with   Decreased Fetal Movement   Kristin Foster is a 25 y.o. G3P1011 at [redacted]w[redacted]d who plans to receive care at [facility].  She presents today for Decreased Fetal Movement.  She states she had not felt movement on Thursday or Friday.  She reports trying side lying position with no improvement.  Patient reports she has had some first trimester bleeding, N/V, increased headaches, and contractions during the pregnancy.  However, she is not receiving care yet and states her first appt is scheduled for Sept 28th. Patient reports continued nausea, but no other concerns today.  She endorses fetal movement currently and states it was felt upon arrival to the room.     OB History     Gravida  3   Para  1   Term  1   Preterm  0   AB  1   Living  1      SAB  1   IAB  0   Ectopic  0   Multiple  0   Live Births  1           Past Medical History:  Diagnosis Date   Migraines     Past Surgical History:  Procedure Laterality Date   CESAREAN SECTION      No family history on file.  Social History   Tobacco Use   Smoking status: Former   Smokeless tobacco: Never  Building services engineer Use: Every day   Substances: Nicotine  Substance Use Topics   Alcohol use: Never   Drug use: Never    Allergies: No Known Allergies  Medications Prior to Admission  Medication Sig Dispense Refill Last Dose   acetaminophen (TYLENOL) 500 MG tablet Take 1,000 mg by mouth every 6 (six) hours as needed.   Past Week   cyclobenzaprine (FLEXERIL) 10 MG tablet Take 1 tablet (10 mg total) by mouth 2 (two) times daily as needed for muscle spasms. 10 tablet 0 Past Week   famotidine (PEPCID) 10 MG tablet Take 10 mg by mouth 2 (two) times daily.   Past Week   Prenatal Vit-Fe Fumarate-FA (PRENATAL VITAMINS PO) Take by  mouth.   05/15/2021    Review of Systems  Constitutional:  Negative for chills and fever.  Gastrointestinal:  Positive for nausea. Negative for abdominal pain and vomiting.  Genitourinary:  Negative for difficulty urinating, dysuria, vaginal bleeding and vaginal discharge.  Neurological:  Negative for dizziness, light-headedness and headaches.  Physical Exam   Blood pressure 112/69, pulse (!) 110, temperature 98.3 F (36.8 C), resp. rate 18, last menstrual period 11/04/2020.  Physical Exam Constitutional:      Appearance: Normal appearance.  HENT:     Head: Normocephalic and atraumatic.  Eyes:     Conjunctiva/sclera: Conjunctivae normal.  Cardiovascular:     Rate and Rhythm: Normal rate.  Pulmonary:     Effort: Pulmonary effort is normal. No respiratory distress.  Musculoskeletal:        General: Normal range of motion.     Cervical back: Normal range of motion.     Right lower leg: No edema.     Left lower leg: No edema.  Skin:    General: Skin is warm and dry.  Neurological:     Mental  Status: She is alert and oriented to person, place, and time.  Psychiatric:        Mood and Affect: Mood normal.        Behavior: Behavior normal.        Thought Content: Thought content normal.    MAU Course  Procedures No results found for this or any previous visit (from the past 24 hour(s)).  MDM EFM Assessment and Plan  24 year old, G3P1011  SIUP at 28.2 weeks Cat I FT Decreased Fetal Movement  -Patient with reports of fetal movement. -Will continue to monitor. -FHT reassuring for GA. -Will plan for discharge with continued reassurance.  Cherre Robins 05/17/2021, 12:30 AM

## 2021-05-17 NOTE — MAU Note (Signed)
Chart opened to answer patient question about time frame of her previous visit.  RN answered questions about previous visit and HIPPA procedures.

## 2021-05-19 ENCOUNTER — Inpatient Hospital Stay (HOSPITAL_COMMUNITY)
Admission: AD | Admit: 2021-05-19 | Discharge: 2021-05-20 | Disposition: A | Discharge status: Home / Self Care | Payer: Medicaid Other | Attending: Obstetrics and Gynecology | Admitting: Obstetrics and Gynecology

## 2021-05-19 DIAGNOSIS — O99613 Diseases of the digestive system complicating pregnancy, third trimester: Secondary | ICD-10-CM | POA: Insufficient documentation

## 2021-05-19 DIAGNOSIS — Z87891 Personal history of nicotine dependence: Secondary | ICD-10-CM | POA: Insufficient documentation

## 2021-05-19 DIAGNOSIS — R824 Acetonuria: Secondary | ICD-10-CM | POA: Insufficient documentation

## 2021-05-19 DIAGNOSIS — O212 Late vomiting of pregnancy: Secondary | ICD-10-CM | POA: Insufficient documentation

## 2021-05-19 DIAGNOSIS — O26893 Other specified pregnancy related conditions, third trimester: Secondary | ICD-10-CM | POA: Insufficient documentation

## 2021-05-19 DIAGNOSIS — Z3A28 28 weeks gestation of pregnancy: Secondary | ICD-10-CM | POA: Insufficient documentation

## 2021-05-19 DIAGNOSIS — K219 Gastro-esophageal reflux disease without esophagitis: Secondary | ICD-10-CM | POA: Insufficient documentation

## 2021-05-20 DIAGNOSIS — Z87891 Personal history of nicotine dependence: Secondary | ICD-10-CM | POA: Diagnosis not present

## 2021-05-20 DIAGNOSIS — O99891 Other specified diseases and conditions complicating pregnancy: Secondary | ICD-10-CM | POA: Diagnosis not present

## 2021-05-20 DIAGNOSIS — O99613 Diseases of the digestive system complicating pregnancy, third trimester: Secondary | ICD-10-CM | POA: Diagnosis not present

## 2021-05-20 DIAGNOSIS — K219 Gastro-esophageal reflux disease without esophagitis: Secondary | ICD-10-CM | POA: Diagnosis not present

## 2021-05-20 DIAGNOSIS — O26893 Other specified pregnancy related conditions, third trimester: Secondary | ICD-10-CM | POA: Diagnosis not present

## 2021-05-20 DIAGNOSIS — Z3A28 28 weeks gestation of pregnancy: Secondary | ICD-10-CM

## 2021-05-20 DIAGNOSIS — R1013 Epigastric pain: Secondary | ICD-10-CM | POA: Diagnosis not present

## 2021-05-20 DIAGNOSIS — R112 Nausea with vomiting, unspecified: Secondary | ICD-10-CM

## 2021-05-20 DIAGNOSIS — R824 Acetonuria: Secondary | ICD-10-CM | POA: Diagnosis not present

## 2021-05-20 DIAGNOSIS — O212 Late vomiting of pregnancy: Secondary | ICD-10-CM | POA: Diagnosis not present

## 2021-05-20 LAB — URINALYSIS, ROUTINE W REFLEX MICROSCOPIC
Bilirubin Urine: NEGATIVE
Glucose, UA: NEGATIVE mg/dL
Hgb urine dipstick: NEGATIVE
Ketones, ur: 15 mg/dL — AB
Leukocytes,Ua: NEGATIVE
Nitrite: NEGATIVE
Protein, ur: NEGATIVE mg/dL
Specific Gravity, Urine: 1.01 (ref 1.005–1.030)
pH: 7 (ref 5.0–8.0)

## 2021-05-20 MED ORDER — ONDANSETRON 4 MG PO TBDP
4.0000 mg | ORAL_TABLET | Freq: Once | ORAL | Status: AC
  Administered 2021-05-20: 4 mg via ORAL
  Filled 2021-05-20: qty 1

## 2021-05-20 MED ORDER — ACETAMINOPHEN 500 MG PO TABS: 1000.0000 mg | ORAL_TABLET | Freq: Once | ORAL | Status: DC

## 2021-05-20 MED ORDER — PANTOPRAZOLE SODIUM 20 MG PO TBEC: 20.0000 mg | DELAYED_RELEASE_TABLET | Freq: Every day | ORAL | 0 refills | Status: DC

## 2021-05-20 MED ORDER — FAMOTIDINE 20 MG PO TABS
20.0000 mg | ORAL_TABLET | Freq: Once | ORAL | Status: AC
  Administered 2021-05-20: 20 mg via ORAL
  Filled 2021-05-20: qty 1

## 2021-05-20 MED ORDER — FAMOTIDINE 10 MG PO TABS: 10.0000 mg | ORAL_TABLET | Freq: Two times a day (BID) | ORAL | 1 refills | Status: DC

## 2021-05-20 MED ORDER — ONDANSETRON 4 MG PO TBDP: 4.0000 mg | ORAL_TABLET | Freq: Three times a day (TID) | ORAL | 0 refills | Status: DC | PRN

## 2021-05-20 NOTE — MAU Note (Signed)
Pt c/o n/v since yesterday whenever she eats or drinks. +FM no bleeding or LOF.

## 2021-05-20 NOTE — Discharge Instructions (Addendum)
You came to the hospital because you had nausea and vomiting. We think this is related to your heartburn and you could also have a viral stomach bug which has made the symptoms worse the last couple of days. We gave you nausea medication and heartburn medication   We also sent those medications to your pharmacy. Please take the Pantoprazole (protonix) every day twice a day. And you can take the nausea medicine as needed. It is also important to avoid foods that trigger your reflux and for the next 4-5 days eating a bland diet. We included instructions on that below.  Please seek medical care if you have vomiting that is constant and you are unable to keep any sips of fluid down, vaginal bleeding, leaking of fluid.

## 2021-05-20 NOTE — MAU Provider Note (Addendum)
History    [id number]  Arrival date and time: 05/19/21 2315    Chief Complaint  Patient presents with   Emesis    HPI Kristin Foster is a 25 y.o. at [redacted]w[redacted]d by [place] with PMHx notable for GERD, who presents for a 2 day history of emesis. Pt states epigastric pain with vomiting. She feels nauseous. She has vomited 4x a day. Has no hematemesis except on one occasion. Foods and drinks trigger vomiting. She last vomited after eating an omelet at 11pm. Prior instances have been after having lemon juice and soup She was previously diagnosed with GERD 2 months ago (03/20/21) and was prescribed Pepcid but has taken it inconsistently. Endorses headache at this time but not frequent headaches. No fever, chest pain, changes in vision, muscle weakness, edema, diarrhea, or bloody stools.       Additionally, pt reports stomach pain located at the epigastric region that occurred a couple of days ago with the episodes of vomiting  She reports having low bp in her 1st pregnancy. She states having difficulty with staying hydrated. Reports having kidney stones 2 months prior.    Vaginal bleeding: no LOF: no Fetal Movement: yes Contractions: no   OB History     Gravida  3   Para  1   Term  1   Preterm  0   AB  1   Living  1      SAB  1   IAB  0   Ectopic  0   Multiple  0   Live Births  1           Past Medical History:  Diagnosis Date   Migraines     Past Surgical History:  Procedure Laterality Date   CESAREAN SECTION      No family history on file.  Social History   Socioeconomic History   Marital status: Married    Spouse name: Not on file   Number of children: Not on file   Years of education: Not on file   Highest education level: Not on file  Occupational History   Not on file  Tobacco Use   Smoking status: Former   Smokeless tobacco: Never  Vaping Use   Vaping Use: Every day   Substances: Nicotine  Substance and Sexual Activity   Alcohol use: Never    Drug use: Never   Sexual activity: Yes  Other Topics Concern   Not on file  Social History Narrative   Not on file   Vaped 1-2 times for 6 years Cigarette smoking socialy, stopped when she was pregnancy  Diet: no fatty foods, no carbs, reports eating healthy   Pt is from [place]   Social Determinants of Health   Financial Resource Strain: Not on file  Food Insecurity: Not on file  Transportation Needs: Not on file  Physical Activity: Not on file  Stress: Not on file  Social Connections: Not on file  Intimate Partner Violence: Not on file   No Known Allergies  No current facility-administered medications on file prior to encounter.   Current Outpatient Medications on File Prior to Encounter  Medication Sig Dispense Refill   acetaminophen (TYLENOL) 500 MG tablet Take 1,000 mg by mouth every 6 (six) hours as needed.     cyclobenzaprine (FLEXERIL) 10 MG tablet Take 1 tablet (10 mg total) by mouth 2 (two) times daily as needed for muscle spasms. 10 tablet 0   Prenatal Vit-Fe Fumarate-FA (PRENATAL VITAMINS PO) Take by  mouth.      Review of Systems  Constitutional:  Negative for fever.  Eyes:  Negative for blurred vision and double vision.  Respiratory:  Negative for shortness of breath.   Cardiovascular:  Negative for chest pain and leg swelling.  Gastrointestinal:  Positive for abdominal pain, heartburn, nausea and vomiting. Negative for blood in stool and diarrhea.  Musculoskeletal:  Negative for myalgias.  Neurological:  Positive for dizziness and headaches. Negative for weakness.   Physical Exam   BP (!) 89/48   Pulse 74   Temp 98.2 F (36.8 C) (Oral)   Resp 18   Ht 5' (1.524 m)   Wt 69.5 kg   LMP 11/04/2020   SpO2 97%   BMI 29.94 kg/m   Patient Vitals for the past 24 hrs:  BP Temp Temp src Pulse Resp SpO2 Height Weight  05/20/21 0303 (!) 89/48 -- -- 74 -- -- -- --  05/20/21 0037 99/65 98.2 F (36.8 C) Oral 91 18 97 % 5' (1.524 m) 69.5 kg    Physical  Exam Constitutional:      General: She is not in acute distress.    Appearance: Normal appearance. She is not toxic-appearing.  HENT:     Head: Normocephalic and atraumatic.  Eyes:     Conjunctiva/sclera: Conjunctivae normal.     Pupils: Pupils are equal, round, and reactive to light.  Pulmonary:     Effort: Pulmonary effort is normal.  Abdominal:     Palpations: Abdomen is soft.     Tenderness: There is abdominal tenderness (Tenderness on palpation of epigastric region).  Musculoskeletal:     Cervical back: Neck supple.  Skin:    General: Skin is warm and dry.  Neurological:     Mental Status: She is alert.    FHT Baseline 140, moderate variability, positive accels, no decels Toco: no contractions  Cat: 1  Labs Results for orders placed or performed during the hospital encounter of 05/19/21 (from the past 24 hour(s))  Urinalysis, Routine w reflex microscopic Urine, Clean Catch     Status: Abnormal   Collection Time: 05/20/21 12:41 AM  Result Value Ref Range   Color, Urine YELLOW YELLOW   APPearance CLEAR CLEAR   Specific Gravity, Urine 1.010 1.005 - 1.030   pH 7.0 5.0 - 8.0   Glucose, UA NEGATIVE NEGATIVE mg/dL   Hgb urine dipstick NEGATIVE NEGATIVE   Bilirubin Urine NEGATIVE NEGATIVE   Ketones, ur 15 (A) NEGATIVE mg/dL   Protein, ur NEGATIVE NEGATIVE mg/dL   Nitrite NEGATIVE NEGATIVE   Leukocytes,Ua NEGATIVE NEGATIVE   Imaging No results found.  MAU Course  Procedures Lab Orders         Urinalysis, Routine w reflex microscopic Urine, Clean Catch    Meds ordered this encounter  Medications   famotidine (PEPCID) tablet 20 mg   ondansetron (ZOFRAN-ODT) disintegrating tablet 4 mg   acetaminophen (TYLENOL) tablet 1,000 mg   DISCONTD: famotidine (PEPCID) 10 MG tablet    Sig: Take 1 tablet (10 mg total) by mouth 2 (two) times daily.    Dispense:  90 tablet    Refill:  1   ondansetron (ZOFRAN ODT) 4 MG disintegrating tablet    Sig: Take 1 tablet (4 mg total) by  mouth every 8 (eight) hours as needed for nausea or vomiting.    Dispense:  20 tablet    Refill:  0   pantoprazole (PROTONIX) 20 MG tablet    Sig: Take 1 tablet (20 mg  total) by mouth daily.    Dispense:  90 tablet    Refill:  0    Imaging Orders  No imaging studies ordered today   MDM Nausea vomiting likely 2/2 worsening GERD triggered by acidic and spicy foods. No evidence of contractions on monitor. Well appearing.  Assessment and Plan  Cornelious Garms is a 25 y.o. at [redacted]w[redacted]d by [place] with PMHx notable for GERD, who presents for a 2-day history of emesis.   #FWB FHT Cat 1  Plan:  #N/V secondary to GERD  Pt N/V likely secondary to GERD. Pt was previously diagnosed with GERD on 03/20/21 and was prescribed Pepcid. Pt has inconsistently taking the medication. UA labs revealed ketonuria likely due to dehydration.   -Prescribe Protonix 20mg  qd PO  -Start Zofran 4mg  oral  -Diet: provide diet education and encourage pt to limit exacerbating foods for GERD such as lemons, tomatoes, and fatty foods  -Encourage pt to drink fluids   #Epigastric abdominal pain secondary to GERD  Pt tender on palpation. Does not radiate.  -Give Tylenol 1000mg  oral once   #Disposition  -plan to discharge  , Medical Student 05/20/21 3:12 AM  GME ATTESTATION:  I saw and evaluated the patient. I agree with the findings and the plan of care as documented in the student's note.  Essentially, 25 yo F G3P1011 at [redacted]w[redacted]d comes in with vomiting and stomach pain for last 3 days, worsened with food. Abdominal exam periumbilical TTP. Treated with ODT Zofran and Pepcid with improvement.    Discussed in detail regarding avoiding trigger foods and handout provided. Also provided PO prescription for protonix and PRN Zofran for nausea.      05/22/21, MD, MPH OB Fellow, Faculty Practice [facility], Center for [facility] 05/20/2021 9:34 AM

## 2021-05-28 ENCOUNTER — Other Ambulatory Visit: Payer: Self-pay

## 2021-05-28 ENCOUNTER — Other Ambulatory Visit (HOSPITAL_COMMUNITY)
Admission: RE | Admit: 2021-05-28 | Discharge: 2021-05-28 | Disposition: A | Discharge status: Home / Self Care | Payer: Medicaid Other | Source: Ambulatory Visit | Attending: Obstetrics and Gynecology | Admitting: Obstetrics and Gynecology

## 2021-05-28 ENCOUNTER — Encounter: Payer: Self-pay | Admitting: *Deleted

## 2021-05-28 ENCOUNTER — Ambulatory Visit (INDEPENDENT_AMBULATORY_CARE_PROVIDER_SITE_OTHER): Payer: Medicaid Other | Admitting: Family Medicine

## 2021-05-28 ENCOUNTER — Telehealth: Payer: Self-pay | Admitting: *Deleted

## 2021-05-28 ENCOUNTER — Other Ambulatory Visit: Payer: Self-pay | Admitting: Obstetrics and Gynecology

## 2021-05-28 ENCOUNTER — Other Ambulatory Visit: Payer: Self-pay | Admitting: Family Medicine

## 2021-05-28 ENCOUNTER — Encounter: Payer: Self-pay | Admitting: Obstetrics and Gynecology

## 2021-05-28 VITALS — BP 112/70 | HR 104 | Wt 161.2 lb

## 2021-05-28 DIAGNOSIS — Z23 Encounter for immunization: Secondary | ICD-10-CM | POA: Diagnosis not present

## 2021-05-28 DIAGNOSIS — Z3480 Encounter for supervision of other normal pregnancy, unspecified trimester: Secondary | ICD-10-CM | POA: Diagnosis present

## 2021-05-28 DIAGNOSIS — R1011 Right upper quadrant pain: Secondary | ICD-10-CM

## 2021-05-28 DIAGNOSIS — O34219 Maternal care for unspecified type scar from previous cesarean delivery: Secondary | ICD-10-CM | POA: Insufficient documentation

## 2021-05-28 NOTE — Patient Instructions (Signed)

## 2021-05-28 NOTE — Telephone Encounter (Signed)
While patient still in office, provided letter and verbally advised of Cesarean Section date of 08-01-21.  ( Patient is aware originally scheduled on 12-2 but rescheduled to accommodate patient preference for female provider.)

## 2021-05-28 NOTE — Progress Notes (Signed)
Orders for CS placed

## 2021-05-28 NOTE — Addendum Note (Signed)
Addended by: Reva Bores on: 05/28/2021 10:44 AM   Modules accepted: Orders

## 2021-05-28 NOTE — Progress Notes (Addendum)
Subjective:   Kristin Foster is a 25 y.o. G3P1011 at [redacted]w[redacted]d by LMP, early ultrasound being seen today for her first obstetrical visit.  Her obstetrical history is significant for smoker and previous c-section . Patient does intend to breast feed. Pregnancy history fully reviewed.  Patient reports nausea and vomiting. Takes her zofran and ends up in MAU for headache cocktail on occasion. Reports RUQ pain on-going.  HISTORY: OB History  Gravida Para Term Preterm AB Living  3 1 1  0 1 1  SAB IAB Ectopic Multiple Live Births  1 0 0 0 1    # Outcome Date GA Lbr Len/2nd Weight Sex Delivery Anes PTL Lv  3 Current           2 SAB 2021          1 Term 12/14/15 [redacted]w[redacted]d   M CS-LTranv   LIV     Birth Comments: arrest of second stage, chorio   Past Medical History:  Diagnosis Date   Migraines    Past Surgical History:  Procedure Laterality Date   CESAREAN SECTION     Family History  Problem Relation Age of Onset   Hypertension Maternal Grandfather    Social History   Tobacco Use   Smoking status: Former   Smokeless tobacco: Never  [redacted]w[redacted]d Use: Every day   Substances: Nicotine  Substance Use Topics   Alcohol use: Never   Drug use: Never   No Known Allergies Current Outpatient Medications on File Prior to Visit  Medication Sig Dispense Refill   acetaminophen (TYLENOL) 500 MG tablet Take 1,000 mg by mouth every 6 (six) hours as needed.     pantoprazole (PROTONIX) 20 MG tablet Take 1 tablet (20 mg total) by mouth daily. 90 tablet 0   Prenatal Vit-Fe Fumarate-FA (PRENATAL VITAMINS PO) Take by mouth.     cyclobenzaprine (FLEXERIL) 10 MG tablet Take 1 tablet (10 mg total) by mouth 2 (two) times daily as needed for muscle spasms. (Patient not taking: Reported on 05/28/2021) 10 tablet 0   ondansetron (ZOFRAN ODT) 4 MG disintegrating tablet Take 1 tablet (4 mg total) by mouth every 8 (eight) hours as needed for nausea or vomiting. (Patient not taking: Reported on 05/28/2021) 20  tablet 0   No current facility-administered medications on file prior to visit.     Exam   Vitals:   05/28/21 0917  BP: 112/70  Pulse: (!) 104  Weight: 161 lb 3.2 oz (73.1 kg)   Fetal Heart Rate (bpm): 148  Pelvic Exam: Perineum: no hemorrhoids, normal perineum   Vulva: normal external genitalia, no lesions   Vagina:  normal mucosa, normal discharge   Cervix: no lesions and normal, pap smear done.    Adnexa: normal adnexa and no mass, fullness, tenderness   Bony Pelvis: average  System: General: well-developed, well-nourished female in no acute distress   Breast:  normal appearance, no masses or tenderness   Skin: normal coloration and turgor, no rashes   Neurologic: oriented, normal, negative, normal mood   Extremities: normal strength, tone, and muscle mass, ROM of all joints is normal   HEENT PERRLA, extraocular movement intact and sclera clear, anicteric   Mouth/Teeth mucous membranes moist, pharynx normal without lesions and dental hygiene good   Neck supple and no masses   Cardiovascular: regular rate and rhythm   Respiratory:  no respiratory distress, normal breath sounds   Abdomen: soft, non-tender; bowel sounds normal; no masses,  no organomegaly     Assessment:   Pregnancy: G3P1011 Patient Active Problem List   Diagnosis Date Noted   Supervision of other normal pregnancy, antepartum 05/28/2021   Previous cesarean delivery affecting pregnancy, antepartum 05/28/2021     Plan:  1. Supervision of other normal pregnancy, antepartum New OB labs/ U/s needs to be scheduled - Tdap vaccine greater than or equal to 7yo IM - Flu Vaccine QUAD 40mo+IM (Fluarix, Fluzone & Alfiuria Quad PF) - Cytology - PAP( [place]) - Culture, OB Urine - Genetic Screening - CBC/D/Plt+RPR+Rh+ABO+RubIgG... - [place] MFM OB COMP + 14 WK; Future - Hemoglobin A1c - Glucose tolerance, 1 hour - CBC  2. Previous cesarean delivery affecting pregnancy, antepartum States she pushed x 7-8  hours and then got infected Desires RCS--adamant about no vaginal delivery  3. RUQ pain Check u/s - US ABDOMEN LIMITED RUQ (LIVER/GB); Future  Initial labs drawn. Continue prenatal vitamins. Genetic Screening discussed, NIPS: ordered. Ultrasound discussed; fetal anatomic survey: ordered. Problem list reviewed and updated. The nature of [facility] Practice with multiple MDs and other Advanced Practice Providers was explained to patient; also emphasized that residents, students are part of our team. Routine obstetric precautions reviewed. Return in 2 weeks (on 06/11/2021).

## 2021-05-29 LAB — CYTOLOGY - PAP
Chlamydia: NEGATIVE
Comment: NEGATIVE
Comment: NORMAL
Diagnosis: NEGATIVE
Neisseria Gonorrhea: NEGATIVE

## 2021-05-29 LAB — GLUCOSE TOLERANCE, 1 HOUR: Glucose, 1Hr PP: 111 mg/dL (ref 65–199)

## 2021-05-29 LAB — CBC/D/PLT+RPR+RH+ABO+RUBIGG...
Antibody Screen: NEGATIVE
Basophils Absolute: 0 10*3/uL (ref 0.0–0.2)
Basos: 0 %
EOS (ABSOLUTE): 0.1 10*3/uL (ref 0.0–0.4)
Eos: 1 %
HCV Ab: 0.1 s/co ratio (ref 0.0–0.9)
HIV Screen 4th Generation wRfx: NONREACTIVE
Hematocrit: 37.3 % (ref 34.0–46.6)
Hemoglobin: 12.5 g/dL (ref 11.1–15.9)
Hepatitis B Surface Ag: NEGATIVE
Immature Grans (Abs): 0.1 10*3/uL (ref 0.0–0.1)
Immature Granulocytes: 1 %
Lymphocytes Absolute: 1.5 10*3/uL (ref 0.7–3.1)
Lymphs: 19 %
MCH: 29.9 pg (ref 26.6–33.0)
MCHC: 33.5 g/dL (ref 31.5–35.7)
MCV: 89 fL (ref 79–97)
Monocytes Absolute: 0.5 10*3/uL (ref 0.1–0.9)
Monocytes: 6 %
Neutrophils Absolute: 5.8 10*3/uL (ref 1.4–7.0)
Neutrophils: 73 %
Platelets: 220 10*3/uL (ref 150–450)
RBC: 4.18 x10E6/uL (ref 3.77–5.28)
RDW: 12.7 % (ref 11.7–15.4)
RPR Ser Ql: NONREACTIVE
Rh Factor: POSITIVE
Rubella Antibodies, IGG: 1.28 index (ref >0.99)
WBC: 7.9 10*3/uL (ref 3.4–10.8)

## 2021-05-29 LAB — HEMOGLOBIN A1C
Est. average glucose Bld gHb Est-mCnc: 97 mg/dL
Hgb A1c MFr Bld: 5 % (ref 4.8–5.6)

## 2021-05-29 LAB — HCV INTERPRETATION

## 2021-05-30 LAB — CULTURE, OB URINE

## 2021-05-30 LAB — URINE CULTURE, OB REFLEX: Organism ID, Bacteria: NO GROWTH

## 2021-06-05 ENCOUNTER — Other Ambulatory Visit: Payer: Self-pay

## 2021-06-05 ENCOUNTER — Ambulatory Visit (HOSPITAL_COMMUNITY)
Admission: RE | Admit: 2021-06-05 | Discharge: 2021-06-05 | Disposition: A | Discharge status: Home / Self Care | Payer: Medicaid Other | Source: Ambulatory Visit | Attending: Family Medicine | Admitting: Family Medicine

## 2021-06-05 DIAGNOSIS — R1011 Right upper quadrant pain: Secondary | ICD-10-CM

## 2021-06-08 ENCOUNTER — Encounter (HOSPITAL_COMMUNITY): Payer: Self-pay | Admitting: Obstetrics & Gynecology

## 2021-06-08 ENCOUNTER — Other Ambulatory Visit: Payer: Self-pay

## 2021-06-08 ENCOUNTER — Inpatient Hospital Stay (HOSPITAL_COMMUNITY)
Admission: AD | Admit: 2021-06-08 | Discharge: 2021-06-08 | Disposition: A | Discharge status: Home / Self Care | Payer: Medicaid Other | Attending: Obstetrics & Gynecology | Admitting: Obstetrics & Gynecology

## 2021-06-08 DIAGNOSIS — Z3A31 31 weeks gestation of pregnancy: Secondary | ICD-10-CM | POA: Insufficient documentation

## 2021-06-08 DIAGNOSIS — M7918 Myalgia, other site: Secondary | ICD-10-CM | POA: Diagnosis not present

## 2021-06-08 DIAGNOSIS — R109 Unspecified abdominal pain: Secondary | ICD-10-CM | POA: Diagnosis present

## 2021-06-08 DIAGNOSIS — B9689 Other specified bacterial agents as the cause of diseases classified elsewhere: Secondary | ICD-10-CM | POA: Diagnosis not present

## 2021-06-08 DIAGNOSIS — N760 Acute vaginitis: Secondary | ICD-10-CM

## 2021-06-08 DIAGNOSIS — Z87891 Personal history of nicotine dependence: Secondary | ICD-10-CM | POA: Diagnosis not present

## 2021-06-08 DIAGNOSIS — O23593 Infection of other part of genital tract in pregnancy, third trimester: Secondary | ICD-10-CM | POA: Diagnosis not present

## 2021-06-08 DIAGNOSIS — O26893 Other specified pregnancy related conditions, third trimester: Secondary | ICD-10-CM | POA: Insufficient documentation

## 2021-06-08 DIAGNOSIS — N898 Other specified noninflammatory disorders of vagina: Secondary | ICD-10-CM

## 2021-06-08 DIAGNOSIS — Z3689 Encounter for other specified antenatal screening: Secondary | ICD-10-CM

## 2021-06-08 LAB — URINALYSIS, ROUTINE W REFLEX MICROSCOPIC
Bilirubin Urine: NEGATIVE
Glucose, UA: NEGATIVE mg/dL
Ketones, ur: NEGATIVE mg/dL
Leukocytes,Ua: NEGATIVE
Nitrite: NEGATIVE
Protein, ur: NEGATIVE mg/dL
Specific Gravity, Urine: 1.014 (ref 1.005–1.030)
pH: 8 (ref 5.0–8.0)

## 2021-06-08 LAB — WET PREP, GENITAL
Clue Cells Wet Prep HPF POC: NONE SEEN
Sperm: NONE SEEN
Trich, Wet Prep: NONE SEEN
Yeast Wet Prep HPF POC: NONE SEEN

## 2021-06-08 MED ORDER — ACETAMINOPHEN 500 MG PO TABS
1000.0000 mg | ORAL_TABLET | Freq: Once | ORAL | Status: AC
  Administered 2021-06-08: 1000 mg via ORAL
  Filled 2021-06-08: qty 2

## 2021-06-08 MED ORDER — METRONIDAZOLE 500 MG PO TABS: 500.0000 mg | ORAL_TABLET | Freq: Two times a day (BID) | ORAL | 0 refills | Status: DC

## 2021-06-08 NOTE — MAU Note (Signed)
Pt reports to mau with c/o pain in pelvic area that feels like pressure.  Pt states pain has been on going for 3 days.  Pt also reports some irreg ctx and abd pain in upper abd.  Pt denies LOF or vag bleeding.  +FM

## 2021-06-08 NOTE — MAU Provider Note (Signed)
History     CSN: [id number]  Arrival date and time: 06/08/21 1241   Event Date/Time   First Provider Initiated Contact with Patient 06/08/21 1331      Chief Complaint  Patient presents with   Abdominal Pain   Pelvic Pain   HPI Kristin Foster is a 25 y.o. G3P1011 at [redacted]w[redacted]d who presents to MAU with chief complaint of pelvic discomfort and pressure "down there" in her vagina. This is a recurrent problem which has worsened throughout the day today. She denies lower abdominal contractions, vaginal bleeding, dysuria, DFM, LOF, fever or recent illness. She denies aggravating or alleviating factors. She has not taken medication or tried other treatments for this complaint.  Patient also c/o mild discomfort and "tight feeling" across the top of her abdomen. This is a recurrent problem. Discomfort resolves when she stretches and lengthens her torso. She has not taken medication or tried other treatments for this complaint. She denies recent physical exertion. She is remote from sexual intercourse.  Patient has initiated prenatal care with [facility].  OB History     Gravida  3   Para  1   Term  1   Preterm  0   AB  1   Living  1      SAB  1   IAB  0   Ectopic  0   Multiple  0   Live Births  1           Past Medical History:  Diagnosis Date   Migraines     Past Surgical History:  Procedure Laterality Date   CESAREAN SECTION      Family History  Problem Relation Age of Onset   Hypertension Maternal Grandfather     Social History   Tobacco Use   Smoking status: Former   Smokeless tobacco: Never  Building services engineer Use: Every day   Substances: Nicotine  Substance Use Topics   Alcohol use: Never   Drug use: Never    Allergies: No Known Allergies  Medications Prior to Admission  Medication Sig Dispense Refill Last Dose   Prenatal Vit-Fe Fumarate-FA (PRENATAL VITAMINS PO) Take by mouth.   06/07/2021   acetaminophen (TYLENOL) 500 MG tablet Take 1,000 mg by  mouth every 6 (six) hours as needed.      cyclobenzaprine (FLEXERIL) 10 MG tablet Take 1 tablet (10 mg total) by mouth 2 (two) times daily as needed for muscle spasms. (Patient not taking: Reported on 05/28/2021) 10 tablet 0    ondansetron (ZOFRAN ODT) 4 MG disintegrating tablet Take 1 tablet (4 mg total) by mouth every 8 (eight) hours as needed for nausea or vomiting. (Patient not taking: Reported on 05/28/2021) 20 tablet 0    pantoprazole (PROTONIX) 20 MG tablet Take 1 tablet (20 mg total) by mouth daily. 90 tablet 0     Review of Systems  Genitourinary:  Positive for pelvic pain.  All other systems reviewed and are negative. Physical Exam   Blood pressure 99/63, pulse 92, temperature 97.7 F (36.5 C), resp. rate 16, last menstrual period 10/30/2020, SpO2 100 %.  Physical Exam Vitals and nursing note reviewed. Exam conducted with a chaperone present.  Constitutional:      General: She is not in acute distress.    Appearance: She is well-developed. She is not ill-appearing.  Cardiovascular:     Rate and Rhythm: Normal rate.     Heart sounds: Normal heart sounds.  Pulmonary:     Effort:  Pulmonary effort is normal.  Abdominal:     Palpations: Abdomen is soft.     Tenderness: There is no abdominal tenderness.     Comments: Gravid  Genitourinary:    Comments: Pelvic exam: External genitalia normal, vaginal walls pink and well rugated, cervix visually closed, no lesions noted. Moderate amount of whitish blue, thin vaginal discharge with strong fishy odor. Suspicious for Bacterial Vaginosis   Skin:    Capillary Refill: Capillary refill takes less than 2 seconds.  Neurological:     Mental Status: She is alert and oriented to person, place, and time.    MAU Course/MDM  Procedures  --Wet prep does not support abnormal discharge identified on physical exam. Will treat for BV --Reactive tracing: baseline 145, mod var, + accels, no decels --Toco: occasional UI --Pain score 4-5/10.  Patient coping well with distraction via multiple phone calls during triage, initial assessment, pelvic exam. CNM explained to patient procedures such as pelvic exam cannot be performed while patient is engaging in phone conversation. Pt verbalizes understanding --Small hemoglobin, rare bacteria on UA. Patient without urinary complaints. Will culture  Orders Placed This Encounter  Procedures   Wet prep, genital   Culture, OB Urine   Urinalysis, Routine w reflex microscopic Urine, Clean Catch   Discharge patient   Patient Vitals for the past 24 hrs:  BP Temp Temp src Pulse Resp SpO2  06/08/21 1430 99/63 97.7 F (36.5 C) -- 92 16 100 %  06/08/21 1429 99/63 -- -- 92 -- 100 %  06/08/21 1400 -- -- -- -- -- 99 %  06/08/21 1257 104/61 97.9 F (36.6 C) Oral (!) 102 15 98 %   Results for orders placed or performed during the hospital encounter of 06/08/21 (from the past 24 hour(s))  Urinalysis, Routine w reflex microscopic Urine, Clean Catch     Status: Abnormal   Collection Time: 06/08/21  1:00 PM  Result Value Ref Range   Color, Urine YELLOW YELLOW   APPearance HAZY (A) CLEAR   Specific Gravity, Urine 1.014 1.005 - 1.030   pH 8.0 5.0 - 8.0   Glucose, UA NEGATIVE NEGATIVE mg/dL   Hgb urine dipstick SMALL (A) NEGATIVE   Bilirubin Urine NEGATIVE NEGATIVE   Ketones, ur NEGATIVE NEGATIVE mg/dL   Protein, ur NEGATIVE NEGATIVE mg/dL   Nitrite NEGATIVE NEGATIVE   Leukocytes,Ua NEGATIVE NEGATIVE   RBC / HPF 0-5 0 - 5 RBC/hpf   WBC, UA 0-5 0 - 5 WBC/hpf   Bacteria, UA RARE (A) NONE SEEN   Squamous Epithelial / LPF 0-5 0 - 5   Mucus PRESENT   Wet prep, genital     Status: Abnormal   Collection Time: 06/08/21  1:38 PM  Result Value Ref Range   Yeast Wet Prep HPF POC NONE SEEN NONE SEEN   Trich, Wet Prep NONE SEEN NONE SEEN   Clue Cells Wet Prep HPF POC NONE SEEN NONE SEEN   WBC, Wet Prep HPF POC MANY (A) NONE SEEN   Sperm NONE SEEN    Meds ordered this encounter  Medications    acetaminophen (TYLENOL) tablet 1,000 mg   metroNIDAZOLE (FLAGYL) 500 MG tablet    Sig: Take 1 tablet (500 mg total) by mouth 2 (two) times daily.    Dispense:  14 tablet    Refill:  0    Order Specific Question:   Supervising Provider    Answer:   Jaynie Collins A [[id number]]    Assessment and Plan  --25  y.o. [id number] at [redacted]w[redacted]d  --Bacterial Vaginosis --Musculoskeletal pain in third trimester --Reactive tracing --Closed cervix, quiet toco --Consider maternity belt, pelvic floor PT PRN --Discharge home in stable condition  Calvert Cantor, MSA, MSN, CNM 06/08/2021, 5:40 PM

## 2021-06-09 LAB — CULTURE, OB URINE: Culture: NO GROWTH

## 2021-06-09 LAB — GC/CHLAMYDIA PROBE AMP (~~LOC~~) NOT AT ARMC
Chlamydia: NEGATIVE
Comment: NEGATIVE
Comment: NORMAL
Neisseria Gonorrhea: NEGATIVE

## 2021-06-10 ENCOUNTER — Other Ambulatory Visit: Payer: Self-pay

## 2021-06-10 ENCOUNTER — Ambulatory Visit (HOSPITAL_COMMUNITY)
Admission: RE | Admit: 2021-06-10 | Discharge: 2021-06-10 | Disposition: A | Discharge status: Home / Self Care | Payer: Medicaid Other | Source: Ambulatory Visit | Attending: Family Medicine | Admitting: Family Medicine

## 2021-06-10 DIAGNOSIS — R1011 Right upper quadrant pain: Secondary | ICD-10-CM | POA: Insufficient documentation

## 2021-06-11 ENCOUNTER — Other Ambulatory Visit: Payer: Self-pay | Admitting: Family Medicine

## 2021-06-11 ENCOUNTER — Other Ambulatory Visit: Payer: Self-pay | Admitting: *Deleted

## 2021-06-11 ENCOUNTER — Ambulatory Visit: Payer: Medicaid Other | Attending: Family Medicine

## 2021-06-11 ENCOUNTER — Telehealth: Payer: Self-pay | Admitting: Family Medicine

## 2021-06-11 DIAGNOSIS — O0933 Supervision of pregnancy with insufficient antenatal care, third trimester: Secondary | ICD-10-CM

## 2021-06-11 DIAGNOSIS — Z3A32 32 weeks gestation of pregnancy: Secondary | ICD-10-CM

## 2021-06-11 DIAGNOSIS — O34219 Maternal care for unspecified type scar from previous cesarean delivery: Secondary | ICD-10-CM

## 2021-06-11 DIAGNOSIS — Z3480 Encounter for supervision of other normal pregnancy, unspecified trimester: Secondary | ICD-10-CM | POA: Diagnosis not present

## 2021-06-11 DIAGNOSIS — O09293 Supervision of pregnancy with other poor reproductive or obstetric history, third trimester: Secondary | ICD-10-CM | POA: Diagnosis not present

## 2021-06-11 DIAGNOSIS — Z363 Encounter for antenatal screening for malformations: Secondary | ICD-10-CM

## 2021-06-12 ENCOUNTER — Encounter: Payer: Self-pay | Admitting: Family Medicine

## 2021-06-12 DIAGNOSIS — O26619 Liver and biliary tract disorders in pregnancy, unspecified trimester: Secondary | ICD-10-CM | POA: Insufficient documentation

## 2021-06-12 DIAGNOSIS — K760 Fatty (change of) liver, not elsewhere classified: Secondary | ICD-10-CM | POA: Insufficient documentation

## 2021-06-13 ENCOUNTER — Telehealth: Payer: Self-pay

## 2021-06-13 NOTE — Telephone Encounter (Signed)
Call placed to pt. Spoke with pt. Pt given results of [place]. Pt has further questions. Pt advised to talk with Dr Annia Friendly at [facility] on 10/18. Pt agreeable and verbalized understanding to plan of care.  Judeth Cornfield, RN

## 2021-06-13 NOTE — Telephone Encounter (Signed)
-----   Message from Reva Bores, MD sent at 06/12/2021  5:29 PM EDT ----- U/s does not show gall bladder disease. May have a fatty infiltrate in the liver and this would not explain her pain.

## 2021-06-17 ENCOUNTER — Ambulatory Visit (INDEPENDENT_AMBULATORY_CARE_PROVIDER_SITE_OTHER): Payer: Medicaid Other | Admitting: Family Medicine

## 2021-06-17 ENCOUNTER — Other Ambulatory Visit: Payer: Self-pay

## 2021-06-17 ENCOUNTER — Encounter: Payer: Self-pay | Admitting: Family Medicine

## 2021-06-17 VITALS — BP 119/78 | HR 110 | Wt 164.8 lb

## 2021-06-17 DIAGNOSIS — Z3480 Encounter for supervision of other normal pregnancy, unspecified trimester: Secondary | ICD-10-CM | POA: Diagnosis not present

## 2021-06-17 DIAGNOSIS — O26619 Liver and biliary tract disorders in pregnancy, unspecified trimester: Secondary | ICD-10-CM

## 2021-06-17 DIAGNOSIS — O34219 Maternal care for unspecified type scar from previous cesarean delivery: Secondary | ICD-10-CM

## 2021-06-17 DIAGNOSIS — K760 Fatty (change of) liver, not elsewhere classified: Secondary | ICD-10-CM

## 2021-06-17 DIAGNOSIS — O26893 Other specified pregnancy related conditions, third trimester: Secondary | ICD-10-CM

## 2021-06-17 DIAGNOSIS — Z3A32 32 weeks gestation of pregnancy: Secondary | ICD-10-CM

## 2021-06-17 DIAGNOSIS — N898 Other specified noninflammatory disorders of vagina: Secondary | ICD-10-CM

## 2021-06-17 DIAGNOSIS — R12 Heartburn: Secondary | ICD-10-CM

## 2021-06-17 MED ORDER — PANTOPRAZOLE SODIUM 20 MG PO TBEC: 20.0000 mg | DELAYED_RELEASE_TABLET | Freq: Every day | ORAL | 2 refills | Status: DC

## 2021-06-17 NOTE — Progress Notes (Signed)
    Subjective:  Kristin Foster is a 25 y.o. G3P1011 at [redacted]w[redacted]d being seen today for ongoing prenatal care.  She is currently monitored for the following issues for this high-risk pregnancy and has Supervision of other normal pregnancy, antepartum; Previous cesarean delivery affecting pregnancy, antepartum; and Fatty liver in mother during pregnancy on their problem list.  Patient reports heartburn. Had protonix prescribed before and worked well, but ran out. Would like to discuss her RUQ [place] and genetic results. Vaginal irritation (BV diagnosed in MAU) improving, worried she will get a yeast infection after finishing antibiotics.  Contractions: Irritability.  .  Movement: Present. Denies leaking of fluid.   The following portions of the patient's history were reviewed and updated as appropriate: allergies, current medications, past family history, past medical history, past social history, past surgical history and problem list.   Objective:   Vitals:   06/17/21 0900  BP: 119/78  Pulse: (!) 110  Weight: 164 lb 12.8 oz (74.8 kg)    Fetal Status: Fetal Heart Rate (bpm): 156 Fundal Height: 33 cm Movement: Present     General:  Alert, oriented and cooperative. Patient is in no acute distress.  Skin: Skin is warm and dry. No rash noted.   Cardiovascular: Normal heart rate noted  Respiratory: Normal respiratory effort, no problems with respiration noted  Abdomen: Soft, gravid, appropriate for gestational age. Pain/Pressure: Present     Pelvic:  Cervical exam deferred        Extremities: Normal range of motion.  Edema: None  Mental Status: Normal mood and affect. Normal behavior. Normal judgment and thought content.    Assessment and Plan:  Pregnancy: G3P1011 at [redacted]w[redacted]d  1. Supervision of other normal pregnancy, antepartum Doing well. Reviewed LR NIPS/negative horizon results.   2. Previous cesarean delivery affecting pregnancy, antepartum Repeat C/S scheduled for 12/2.  3. Fatty liver in  mother during pregnancy Discussed finding with patient. Encouraged well balanced diet and physical activity as tolerated.   4. [redacted] weeks gestation of pregnancy  5. Vaginal discharge during pregnancy in third trimester Recent BV, finishing flagyl. Instructed to call or use OTC 7 day vaginal yeast cream if develops symptoms consistent with this.   6. Heartburn during pregnancy in third trimester - pantoprazole (PROTONIX) 20 MG tablet; Take 1 tablet (20 mg total) by mouth daily.  Dispense: 30 tablet; Refill: 2  Preterm labor symptoms and general obstetric precautions including but not limited to vaginal bleeding, contractions, leaking of fluid and fetal movement were reviewed in detail with the patient. Please refer to After Visit Summary for other counseling recommendations.  Return in about 2 weeks (around 07/01/2021) for HROB.   Allayne Stack, DO

## 2021-06-17 NOTE — Patient Instructions (Signed)
If you have any vaginal itching or irritation, you can give [place] a call and we can send some vaginal yeast cream in for you.   Safe Medications in Pregnancy   Yeast infection: Gyne-lotrimin 7 Monistat 7

## 2021-06-19 ENCOUNTER — Inpatient Hospital Stay (HOSPITAL_COMMUNITY)
Admission: AD | Admit: 2021-06-19 | Discharge: 2021-06-19 | Disposition: A | Discharge status: Home / Self Care | Payer: Medicaid Other | Attending: Obstetrics and Gynecology | Admitting: Obstetrics and Gynecology

## 2021-06-19 ENCOUNTER — Other Ambulatory Visit: Payer: Self-pay

## 2021-06-19 ENCOUNTER — Encounter (HOSPITAL_COMMUNITY): Payer: Self-pay | Admitting: Obstetrics and Gynecology

## 2021-06-19 DIAGNOSIS — O34219 Maternal care for unspecified type scar from previous cesarean delivery: Secondary | ICD-10-CM | POA: Insufficient documentation

## 2021-06-19 DIAGNOSIS — Z3A33 33 weeks gestation of pregnancy: Secondary | ICD-10-CM | POA: Insufficient documentation

## 2021-06-19 DIAGNOSIS — Z87891 Personal history of nicotine dependence: Secondary | ICD-10-CM | POA: Diagnosis not present

## 2021-06-19 DIAGNOSIS — R102 Pelvic and perineal pain: Secondary | ICD-10-CM

## 2021-06-19 DIAGNOSIS — N898 Other specified noninflammatory disorders of vagina: Secondary | ICD-10-CM | POA: Diagnosis not present

## 2021-06-19 DIAGNOSIS — Z3480 Encounter for supervision of other normal pregnancy, unspecified trimester: Secondary | ICD-10-CM

## 2021-06-19 DIAGNOSIS — O26893 Other specified pregnancy related conditions, third trimester: Secondary | ICD-10-CM

## 2021-06-19 LAB — WET PREP, GENITAL
Clue Cells Wet Prep HPF POC: NONE SEEN
Sperm: NONE SEEN
Trich, Wet Prep: NONE SEEN
WBC, Wet Prep HPF POC: NONE SEEN
Yeast Wet Prep HPF POC: NONE SEEN

## 2021-06-19 LAB — URINALYSIS, ROUTINE W REFLEX MICROSCOPIC
Bilirubin Urine: NEGATIVE
Glucose, UA: NEGATIVE mg/dL
Hgb urine dipstick: NEGATIVE
Ketones, ur: 5 mg/dL — AB
Leukocytes,Ua: NEGATIVE
Nitrite: NEGATIVE
Protein, ur: NEGATIVE mg/dL
Specific Gravity, Urine: 1.011 (ref 1.005–1.030)
pH: 7 (ref 5.0–8.0)

## 2021-06-19 NOTE — MAU Note (Signed)
Presents with c/o vaginal pain, reports has white vaginal discharge.  States discharge has a bad odor.  States was previously treated for BV. Denies VB or LOF.  Endorses +FM.

## 2021-06-19 NOTE — MAU Provider Note (Addendum)
History   CSN: [id number]  Arrival date and time: 06/19/21 1135   Event Date/Time   First Provider Initiated Contact with Patient 06/19/21 1210     Chief Complaint  Patient presents with   Vaginal Pain   25 yo G3P1011 @[redacted]w[redacted]d  presenting to MAU for ongoing pelvic discomfort, vaginal odor, and discharge. She is also having lower abdominal discomfort and pressure like discomfort with urination. She denies fever, back pain, and emesis. Nausea is chronic throughout this pregnancy. She reports good fetal movement. She denies vaginal bleeding, leakage of fluid, or regular contractions. She was recently seen in MAU 10/09 for this issue and screened for BV and STI which were all negative. She has had improvement in pelvic pain and discomfort since her last MAU visit. She would like a pelvic exam today.   OB History     Gravida  3   Para  1   Term  1   Preterm  0   AB  1   Living  1      SAB  1   IAB  0   Ectopic  0   Multiple  0   Live Births  1           Past Medical History:  Diagnosis Date   Migraines     Past Surgical History:  Procedure Laterality Date   CESAREAN SECTION      Family History  Problem Relation Age of Onset   Hypertension Maternal Grandfather     Social History   Tobacco Use   Smoking status: Former   Smokeless tobacco: Never  12/09 Use: Every day   Substances: Nicotine  Substance Use Topics   Alcohol use: Never   Drug use: Never    Allergies: No Known Allergies  Medications Prior to Admission  Medication Sig Dispense Refill Last Dose   acetaminophen (TYLENOL) 500 MG tablet Take 1,000 mg by mouth every 6 (six) hours as needed.      cyclobenzaprine (FLEXERIL) 10 MG tablet Take 1 tablet (10 mg total) by mouth 2 (two) times daily as needed for muscle spasms. (Patient not taking: No sig reported) 10 tablet 0    metroNIDAZOLE (FLAGYL) 500 MG tablet Take 1 tablet (500 mg total) by mouth 2 (two) times daily. 14 tablet 0     ondansetron (ZOFRAN ODT) 4 MG disintegrating tablet Take 1 tablet (4 mg total) by mouth every 8 (eight) hours as needed for nausea or vomiting. (Patient not taking: No sig reported) 20 tablet 0    pantoprazole (PROTONIX) 20 MG tablet Take 1 tablet (20 mg total) by mouth daily. 30 tablet 2    Prenatal Vit-Fe Fumarate-FA (PRENATAL VITAMINS PO) Take by mouth.       Review of Systems  Constitutional:  Negative for fever.  Respiratory:  Negative for shortness of breath.   Cardiovascular:  Negative for chest pain.  Gastrointestinal:  Positive for nausea. Negative for abdominal pain, constipation, diarrhea and vomiting.  Genitourinary:  Positive for pelvic pain, vaginal discharge and vaginal pain. Negative for difficulty urinating, dysuria and vaginal bleeding.  Neurological:  Negative for light-headedness.   Physical Exam   Blood pressure 105/67, pulse (!) 110, temperature 98.1 F (36.7 C), temperature source Oral, resp. rate 20, height 5' (1.524 m), weight 74.3 kg, last menstrual period 10/30/2020, SpO2 99 %.  Results for orders placed or performed during the hospital encounter of 06/19/21 (from the past 24 hour(s))  Urinalysis, Routine w reflex  microscopic Urine, Clean Catch     Status: Abnormal   Collection Time: 06/19/21 12:00 PM  Result Value Ref Range   Color, Urine YELLOW YELLOW   APPearance HAZY (A) CLEAR   Specific Gravity, Urine 1.011 1.005 - 1.030   pH 7.0 5.0 - 8.0   Glucose, UA NEGATIVE NEGATIVE mg/dL   Hgb urine dipstick NEGATIVE NEGATIVE   Bilirubin Urine NEGATIVE NEGATIVE   Ketones, ur 5 (A) NEGATIVE mg/dL   Protein, ur NEGATIVE NEGATIVE mg/dL   Nitrite NEGATIVE NEGATIVE   Leukocytes,Ua NEGATIVE NEGATIVE  Wet prep, genital     Status: None   Collection Time: 06/19/21 12:05 PM   Specimen: Vaginal  Result Value Ref Range   Yeast Wet Prep HPF POC NONE SEEN NONE SEEN   Trich, Wet Prep NONE SEEN NONE SEEN   Clue Cells Wet Prep HPF POC NONE SEEN NONE SEEN   WBC, Wet  Prep HPF POC NONE SEEN NONE SEEN   Sperm NONE SEEN      Physical Exam Vitals reviewed. Exam conducted with a chaperone present.  Constitutional:      General: She is not in acute distress.    Appearance: Normal appearance. She is not ill-appearing or toxic-appearing.  Pulmonary:     Effort: Pulmonary effort is normal.  Genitourinary:    General: Normal vulva.     Vagina: No vaginal discharge, tenderness or bleeding.     Cervix: No lesion, erythema or cervical bleeding.  Neurological:     Mental Status: She is alert and oriented to person, place, and time.  Psychiatric:        Mood and Affect: Mood normal.        Behavior: Behavior normal.    MAU Course  Procedures  MDM 1210 - Seen in family room w/ pending vaginal swabs. Discussed sx with patient. Complaint of pelvic pain and desires pelvic exam in addition to testing today. Reviewed wet prep and UA which are both normal.   1250 - Moved patient to exam room. Pelvic exam unremarkable; cervix visualized and closed, normal discharge present. Patient clarified that her discomfort is actually pelvic pressure and not pelvic pain.   Assessment and Plan  1. Supervision of other normal pregnancy, antepartum  2. Previous cesarean delivery affecting pregnancy, antepartum  3. Pelvic pressure in pregnancy - Discharge patient  4. Vaginal discharge during pregnancy in third trimester - Discharge patient  Discussed belly band and positioning to alleviate pelvic pressure. Patient has follow up scheduled for 11/1 with Dr. Crissie Reese.   Simone Autry-Lott 06/19/2021, 12:30 PM   GME ATTESTATION:  I saw and evaluated the patient. I agree with the findings and the plan of care as documented in the resident's note. I have made changes to documentation as necessary.  Patient presenting with pelvic pressure and vaginal discharge. UA and wet prep normal. Pelvic exam normal with closed cervix. No contractions, LOF, or vaginal bleeding.    Unlikely infectious cause. GC/CT pending but previously negative on 06/08/21. Discussed that increased normal discharge can be occur in pregnancy. Pelvic pressure in the third trimester can also be normal.   Recommended abdominal binder to assist with increasing pressure with breaks as needed since patient notes the pressure is worse when she is moving around a lot. Return precautions reviewed should she develop actual pelvic pain, vaginal irritation, LOF, or bleeding.   Stable for discharge with follow up in clinic as scheduled.   Patient voiced understanding and is agreeable to plan.  Trula Ore  Mathis Fare, MD OB Fellow, Faculty [facility] for [facility] 06/19/2021 2:42 PM

## 2021-06-19 NOTE — Discharge Instructions (Addendum)
Try wearing an abdominal binder to help with pelvic pressure. Take breaks as needed.  Return for your follow up prenatal visit on 07/01/21 as scheduled. Return to MAU as needed.

## 2021-06-20 LAB — GC/CHLAMYDIA PROBE AMP (~~LOC~~) NOT AT ARMC
Chlamydia: NEGATIVE
Comment: NEGATIVE
Comment: NORMAL
Neisseria Gonorrhea: NEGATIVE

## 2021-07-01 ENCOUNTER — Other Ambulatory Visit: Payer: Self-pay

## 2021-07-01 ENCOUNTER — Ambulatory Visit (INDEPENDENT_AMBULATORY_CARE_PROVIDER_SITE_OTHER): Payer: Medicaid Other | Admitting: Nurse Practitioner

## 2021-07-01 VITALS — BP 105/72 | HR 102 | Wt 164.9 lb

## 2021-07-01 DIAGNOSIS — O26893 Other specified pregnancy related conditions, third trimester: Secondary | ICD-10-CM

## 2021-07-01 DIAGNOSIS — Z3480 Encounter for supervision of other normal pregnancy, unspecified trimester: Secondary | ICD-10-CM

## 2021-07-01 DIAGNOSIS — R102 Pelvic and perineal pain: Secondary | ICD-10-CM

## 2021-07-01 DIAGNOSIS — O34219 Maternal care for unspecified type scar from previous cesarean delivery: Secondary | ICD-10-CM

## 2021-07-01 NOTE — Progress Notes (Signed)
    Subjective:  Kristin Foster is a 25 y.o. G3P1011 at [redacted]w[redacted]d being seen today for ongoing prenatal care.  She is currently monitored for the following issues for this low-risk pregnancy and has Supervision of other normal pregnancy, antepartum; Previous cesarean delivery affecting pregnancy, antepartum; and Fatty liver in mother during pregnancy on their problem list.  Patient reports  pelvic pain .  Contractions: Irritability. Vag. Bleeding: None.  Movement: Present. Denies leaking of fluid.   The following portions of the patient's history were reviewed and updated as appropriate: allergies, current medications, past family history, past medical history, past social history, past surgical history and problem list. Problem list updated.  Objective:   Vitals:   07/01/21 1100  BP: 105/72  Pulse: (!) 102  Weight: 164 lb 14.4 oz (74.8 kg)    Fetal Status: Fetal Heart Rate (bpm): 164   Movement: Present     General:  Alert, oriented and cooperative. Patient is in no acute distress.  Skin: Skin is warm and dry. No rash noted.   Cardiovascular: Normal heart rate noted  Respiratory: Normal respiratory effort, no problems with respiration noted  Abdomen: Soft, gravid, appropriate for gestational age. Pain/Pressure: Present     Pelvic:  Cervical exam deferred        Extremities: Normal range of motion.  Edema: None  Mental Status: Normal mood and affect. Normal behavior. Normal judgment and thought content.   Urinalysis:      Assessment and Plan:  Pregnancy: G3P1011 at [redacted]w[redacted]d  1. Supervision of other normal pregnancy, antepartum Doing well.  Baby moving well.  2. Pelvic pain during pregnancy in third trimester, antepartum Having pain at pubic symphysis with palpation and radiating into groin bilaterally and vagina - causing her pain when walking and when standing on one leg Made referral to PT  Also demonstrated exercises to stretch lower back Advised being in pool for exercise -  Ambulatory referral to Physical Therapy  3. Previous cesarean delivery affecting pregnancy, antepartum Scheduled for repeat C/S   Preterm labor symptoms and general obstetric precautions including but not limited to vaginal bleeding, contractions, leaking of fluid and fetal movement were reviewed in detail with the patient. Please refer to After Visit Summary for other counseling recommendations.  Return in about 8 days (around 07/09/2021) for In person ROB.  Will need vaginal swabs at next visit.  Nolene Bernheim, RN, MSN, NP-BC Nurse Practitioner, [facility] for [employer], [facility] 07/01/2021 12:36 PM

## 2021-07-01 NOTE — Patient Instructions (Signed)
BRAINSTORMING  Develop a Plan Goals: Provide a way to start conversation about your new life with a baby Assist parents in recognizing and using resources within their reach Help pave the way before birth for an easier period of transition afterwards.  Make a list of the following information to keep in a central location: Full name of Mom and Partner: _____________________________________________ 53 full name and Date of Birth: ___________________________________________ Home Address: ___________________________________________________________ ________________________________________________________________________ Home Phone: ____________________________________________________________ Parents' cell numbers: _____________________________________________________ ________________________________________________________________________ Name and contact info for OB: ______________________________________________ Name and contact info for Pediatrician:________________________________________ Contact info for Lactation Consultants: ________________________________________  REST and SLEEP *You each need at least 4-5 hours of uninterrupted sleep every day. Write specific names and contact information.* How are you going to rest in the postpartum period? While partner's home? When partner returns to work? When you both return to work? Where will your baby sleep? Who is available to help during the day? Evening? Night? Who could move in for a period to help support you? What are some ideas to help you get enough sleep? __________________________________________________________________________________________________________________________________________________________________________________________________________________________________________ NUTRITIOUS FOOD AND DRINK *Plan for meals before your baby is born so you can have healthy food to eat during the immediate postpartum  period.* Who will look after breakfast? Lunch? Dinner? List names and contact information. Brainstorm quick, healthy ideas for each meal. What can you do before baby is born to prepare meals for the postpartum period? How can others help you with meals? Which grocery stores provide online shopping and delivery? Which restaurants offer take-out or delivery options? ______________________________________________________________________________________________________________________________________________________________________________________________________________________________________________________________________________________________________________________________________________________________________________________________________  CARE FOR MOM *It's important that mom is cared for and pampered in the postpartum period. Remember, the most important ways new mothers need care are: sleep, nutrition, gentle exercise, and time off.* Who can come take care of mom during this period? Make a list of people with their contact information. List some activities that make you feel cared for, rested, and energized? Who can make sure you have opportunities to do these things? Does mom have a space of her very own within your home that's just for her? Make a "[facility]" where she can be comfortable, rest, and renew herself daily. ______________________________________________________________________________________________________________________________________________________________________________________________________________________________________________________________________________________________________________________________________________________________________________________________________    CARE FOR AND FEEDING BABY *Knowledgeable and encouraging people will offer the best support with regard to feeding your baby.* Educate yourself and choose the best feeding option  for your baby. Make a list of people who will guide, support, and be a resource for you as your care for and feed your baby. (Friends that have breastfed or are currently breastfeeding, lactation consultants, breastfeeding support groups, etc.) Consider a postpartum doula. (These websites can give you information: dona.org & BuyingShow.es) Seek out local breastfeeding resources like the breastfeeding support group at [employer] or [employer]. ______________________________________________________________________________________________________________________________________________________________________________________________________________________________________________________________________________________________________________________________________________________________________________________________________  Verner Chol AND ERRANDS Who can help with a thorough cleaning before baby is born? Make a list of people who will help with housekeeping and chores, like laundry, light cleaning, dishes, bathrooms, etc. Who can run some errands for you? What can you do to make sure you are stocked with basic supplies before baby is born? Who is going to do the shopping? ______________________________________________________________________________________________________________________________________________________________________________________________________________________________________________________________________________________________________________________________________________________________________________________________________     Family Adjustment *Nurture yourselves.it helps parents be more loving and allows for better bonding with their child.* What sorts of things do you and partner enjoy doing together? Which activities help you to connect and strengthen your relationship? Make a list of those things. Make a list of people whom you  trust to care for your baby so you  can have some time together as a couple. What types of things help partner feel connected to Mom? Make a list. What needs will partner have in order to bond with baby? Other children? Who will care for them when you go into labor and while you are in the hospital? Think about what the needs of your older children might be. Who can help you meet those needs? In what ways are you helping them prepare for bringing baby home? List some specific strategies you have for family adjustment. _______________________________________________________________________________________________________________________________________________________________________________________________________________________________________________________________________________________________________________________________________________  SUPPORT *Someone who can empathize with experiences normalizes your problems and makes them more bearable.* Make a list of other friends, neighbors, and/or co-workers you know with infants (and small children, if applicable) with whom you can connect. Make a list of local or online support groups, mom groups, etc. in which you can be involved. ______________________________________________________________________________________________________________________________________________________________________________________________________________________________________________________________________________________________________________________________________________________________________________________________________  Childcare Plans Investigate and plan for childcare if mom is returning to work. Talk about mom's concerns about her transition back to work. Talk about partner's concerns regarding this transition.  Mental Health *Your mental health is one of the highest priorities for a pregnant or postpartum mom.* 1 in 5 women experience anxiety and/or depression from the time  of conception through the first year after birth. Postpartum Mood Disorders are the #1 complication of pregnancy and childbirth and the suffering experienced by these mothers is not necessary! These illnesses are temporary and respond well to treatment, which often includes self-care, social support, talk therapy, and medication when needed. Women experiencing anxiety and depression often say things like: "I'm supposed to be happy.why do I feel so sad?", "Why can't I snap out of it?", "I'm having thoughts that scare me." There is no need to be embarrassed if you are feeling these symptoms: Overwhelmed, anxious, angry, sad, guilty, irritable, hopeless, exhausted but can't sleep You are NOT alone. You are NOT to blame. With help, you WILL be well. Where can I find help? Medical professionals such as your OB, midwife, gynecologist, family practitioner, primary care provider, pediatrician, or mental health providers; [facility] support groups: Feelings After Birth, Breastfeeding Support Group, Baby and Me Group, and Fit 4 Two exercise classes. You have permission to ask for help. It will confirm your feelings, validate your experiences, share/learn coping strategies, and gain support and encouragement as you heal. You are important! BRAINSTORM Make a list of local resources, including resources for mom and for partner. Identify support groups. Identify people to call late at night - include names and contact info. Talk with partner about perinatal mood and anxiety disorders. Talk with your OB, midwife, and doula about baby blues and about perinatal mood and anxiety disorders. Talk with your pediatrician about perinatal mood and anxiety disorders.   Support & Sanity Savers   What do you really need?  Basics In preparing for a new baby, many expectant parents spend hours shopping for baby clothes, decorating the nursery, and deciding which car seat to buy. Yet most don't think much about what  the reality of parenting a newborn will be like, and what they need to make it through that. So, here is the advice of experienced parents. We know you'll read this, and think "they're exaggerating, I don't really need that." Just trust [place] on these, OK? Plan for all of this, and if it turns out you don't need it, come back and teach [place] how you did it!  Must-Haves (Once baby's survival  needs are met, make sure you attend to your own survival needs!) Sleep An average newborn sleeps 16-18 hours per day, over 6-7 sleep periods, rarely more than three hours at a time. It is normal and healthy for a newborn to wake throughout the night... but really hard on parents!! Naps. Prioritize sleep above any responsibilities like: cleaning house, visiting friends, running errands, etc.  Sleep whenever baby sleeps. If you can't nap, at least have restful times when baby eats. The more rest you get, the more patient you will be, the more emotionally stable, and better at solving problems.  Food You may not have realized it would be difficult to eat when you have a newborn. Yet, when we talk to countless new parents, they say things like "it may be 2:00 pm when I realize I haven't had breakfast yet." Or "every time we sit down to dinner, baby needs to eat, and my food gets cold, so I don't bother to eat it." Finger food. Before your baby is born, stock up with one months' worth of food that: 1) you can eat with one hand while holding a baby, 2) doesn't need to be prepped, 3) is good hot or cold, 4) doesn't spoil when left out for a few hours, and 5) you like to eat. Think about: nuts, dried fruit, Clif bars, pretzels, jerky, gogurt, baby carrots, apples, bananas, crackers, cheez-n-crackers, string cheese, hot pockets or frozen burritos to microwave, garden burgers and breakfast pastries to put in the toaster, yogurt drinks, etc. Restaurant Menus. Make lists of your favorite restaurants & menu items. When family/friends  want to help, you can give specific information without much thought. They can either bring you the food or send gift cards for just the right meals. Freezer Meals.  Take some time to make a few meals to put in the freezer ahead of time.  Easy to freeze meals can be anything such as soup, lasagna, chicken pie, or spaghetti sauce. Set up a Meal Schedule.  Ask friends and family to sign up to bring you meals during the first few weeks of being home. (It can be passed around at baby showers!) You have no idea how helpful this will be until you are in the throes of parenting.  https://hamilton-woodard.com/ is a great website to check out. Emotional Support Know who to call when you're stressed out. Parenting a newborn is very challenging work. There are times when it totally overwhelms your normal coping abilities. EVERY NEW PARENT NEEDS TO HAVE A PLAN FOR WHO TO CALL WHEN THEY JUST CAN'T COPE ANY MORE. (And it has to be someone other than the baby's other parent!) Before your baby is born, come up with at least one person you can call for support - write their phone number down and post it on the refrigerator. Anxiety & Sadness. Baby blues are normal after pregnancy; however, there are more severe types of anxiety & sadness which can occur and should not be ignored.  They are always treatable, but you have to take the first step by reaching out for help. [facility] offers a "Mom Talk" group which meets every Tuesday from 10 am - 11 am.  This group is for new moms who need support and connection after their babies are born.  Call [phone].  Really, Really Helpful (Plan for them! Make sure these happen often!!) Physical Support with Taking Care of Yourselves Asking friends and family. Before your baby is born, set up a schedule  of people who can come and visit and help out (or ask a friend to schedule for you). Any time someone says “let me know what I can do to help,” sign them up for a day. When they get  there, their job is not to take care of the baby (that's your job and your joy). Their job is to take care of you!  °Postpartum doulas. If you don't have anyone you can call on for support, look into postpartum doulas:  professionals at helping parents with caring for baby, caring for themselves, getting breastfeeding started, and helping with household tasks. www.padanc.org is a helpful website for learning about doulas in our area. °Peer Support / Parent Groups °Why: One of the greatest ideas for new parents is to be around other new parents. Parent groups give you a chance to share and listen to others who are going through the same season of life, get a sense of what is normal infant development by watching several babies learn and grow, share your stories of triumph and struggles with empathetic ears, and forgive your own mistakes when you realize all parents are learning by trial and error. °Where to find: There are many places you can meet other new parents throughout our community.  [facility] offers the following classes for new moms and their little ones:  Baby and Me (Birth to Crawling) and Breastfeeding Support Group. Go to www.conehealthybaby.com or call 336-832-6682 for more information. °Time for your Relationship °It's easy to get so caught up in meeting baby's immediate needs that it's hard to find time to connect with your partner, and meet the needs of your relationship. It's also easy to forget what “quality time with your partner” actually looks like. If you take your baby on a date, you'd be amazed how much of your couple time is spent feeding the baby, diapering the baby, admiring the baby, and talking about the baby. °Dating: Try to take time for just the two of you. Babysitter tip: Sometimes when moms are breastfeeding a newborn, they find it hard to figure out how to schedule outings around baby's unpredictable feeding schedules. Have the babysitter come for a three hour period. When  she comes over, if baby has just eaten, you can leave right away, and come back in two hours. If baby hasn't fed recently, you start the date at home. Once baby gets hungry and gets a good feeding in, you can head out for the rest of your date time. °Date Nights at Home: If you can't get out, at least set aside one evening a week to prioritize your relationship: whenever baby dozes off or doesn't have any immediate needs, spend a little time focusing on each other. °Potential conflicts: The main relationship conflicts that come up for new parents are: issues related to sexuality, financial stresses, a feeling of an unfair division of household tasks, and conflicts in parenting styles. The more you can work on these issues before baby arrives, the better!  °Fun and Frills (Don't forget these… and don't feel guilty for indulging in them!) °Everyone has something in life that is a fun little treat that they do just for themselves. It may be: reading the morning paper, or going for a daily jog, or having coffee with a friend once a week, or going to a movie on Friday nights, or fine chocolates, or bubble baths, or curling up with a good book. °Unless you do fun things for yourself every now and then,   it's hard to have the energy for fun with your baby. Whatever your “special” treats are, make sure you find a way to continue to indulge in them after your baby is born. These special moments can recharge you, and allow you to return to baby with a new joy ° ° °PERINATAL MOOD DISORDERS: MATERNAL MENTAL HEALTH FROM CONCEPTION THROUGH THE POSTPARTUM PERIOD ° ° °_________________________________________Emergency and Crisis Resources °If you are an imminent risk to self or others, are experiencing intense personal distress, and/or have noticed significant changes in activities of daily living, call:  °911 °[facility]: 336-890-2700 ° [address], [place], [place], [address] °Mobile Crisis:  877-626-1772 °National Suicide Hotline: 988 °Or visit the following crisis centers: °Local Emergency Departments °[facility]: [address], [place]  °336-676-6840. Hours: 8:30AM-5PM. Insurance Accepted: Medicaid, Medicare, and Uninsured.  °[facility]:  [address], [place]  °Mon-Friday 8am-3pm, [phone]   ° °                                                                              ___________ Non-Crisis Resources °To identify specific providers that are covered by your insurance, contact your insurance company or local agencies:  °[facility]: 1-800-256-2452 °[facility]: 888-581-9988 °[facility]: 1-800-939-5911 °Postpartum Support International- Warm-line: 1-800-944-4773  ° °                                                   __Outpatient Therapy and Medication Management  ° °Providers:  °[facility]: 336-292-1510 °Hours: 9AM-5PM  Insurance Accepted: AARP, Aetna, BCBS, Cigna, [employer], Humana, Medicare  °[facility] 336-271-5888 °Hours: 8AM-5:30PM  nsurance Accepted: All insurances EXCEPT AARP, Aetna, [employer], and Humana °[facility]: 336-387-6161 °Hours: 8AM-8PM Insurance Accepted: Aetna, BCBS, Cigna, [employer], Medicaid, Medicare, Uninsured °[facility]: [phone]- [phone] °[facility]: [phone] °Hours: 8:30AM-7PM Insurance Accepted: Aetna, BCBS, Medicaid, Medicare, Tricare, United Healthcare °[facility]:  [phone]  ° Hours:9AM-5PM Insurance Accepted:  Aetna, BCBS, [facility], Medicaid, United Health Care  °[facility]: [phone] °Hours: 9AM-5:30PM Insurance Accepted: AARP, Aetna, BCBS, Cigna, and Medicaid, Medicare, United Health Care °[facility]:  [phone] °Hours: 9am-5pm Insurance Accepted: BCBS; they do not accept Medicaid/Medicare °[facility]: [phone] °Hours: 9am-9pm Insurance Accepted: All major insurance including Medicaid and Medicare °[facility]: [phone] °Hours: 9AM- 5PM Insurance Accepted: All insurances EXCEPT Medicaid and Medicare. °[facility]: 336-334-5662 ° ° °____________                                                                     Parenting Support Groups °[facility]: 336-832-6682 °[facility]:  336- [phone]- [phone] °Family Support Network: (support for children in the NICU   and/or with special needs), (808)063-1312   ___________                                                                 Mental Health Support Groups Mental Health Association: 725-125-3722    _____________                                                                                  Online Resources Postpartum Support International: http://jones-berg.com/  622-297-9GXQ 2Moms Supporting Moms:  www.momssupportingmoms.net

## 2021-07-09 ENCOUNTER — Encounter: Payer: Medicaid Other | Admitting: Obstetrics and Gynecology

## 2021-07-09 ENCOUNTER — Other Ambulatory Visit: Payer: Self-pay

## 2021-07-09 ENCOUNTER — Encounter: Payer: Self-pay | Admitting: *Deleted

## 2021-07-09 ENCOUNTER — Ambulatory Visit: Payer: Medicaid Other | Attending: Obstetrics

## 2021-07-09 ENCOUNTER — Ambulatory Visit: Payer: Medicaid Other | Admitting: *Deleted

## 2021-07-09 VITALS — BP 107/66 | HR 101

## 2021-07-09 DIAGNOSIS — O34219 Maternal care for unspecified type scar from previous cesarean delivery: Secondary | ICD-10-CM | POA: Diagnosis present

## 2021-07-09 DIAGNOSIS — Z3480 Encounter for supervision of other normal pregnancy, unspecified trimester: Secondary | ICD-10-CM | POA: Diagnosis present

## 2021-07-09 DIAGNOSIS — Z3A36 36 weeks gestation of pregnancy: Secondary | ICD-10-CM

## 2021-07-09 DIAGNOSIS — O09293 Supervision of pregnancy with other poor reproductive or obstetric history, third trimester: Secondary | ICD-10-CM | POA: Diagnosis not present

## 2021-07-10 ENCOUNTER — Telehealth: Payer: Medicaid Other | Admitting: Obstetrics & Gynecology

## 2021-07-10 NOTE — Progress Notes (Signed)
Patient was rescheduled until next week given lack of transportation for this visit despite multiple attempts by office staff, and she declined doing virtual encounter.  Jaynie Collins, MD

## 2021-07-17 ENCOUNTER — Ambulatory Visit (INDEPENDENT_AMBULATORY_CARE_PROVIDER_SITE_OTHER): Payer: Medicaid Other | Admitting: Family Medicine

## 2021-07-17 ENCOUNTER — Other Ambulatory Visit (HOSPITAL_COMMUNITY)
Admission: RE | Admit: 2021-07-17 | Discharge: 2021-07-17 | Disposition: A | Discharge status: Home / Self Care | Payer: Medicaid Other | Source: Ambulatory Visit | Attending: Family Medicine | Admitting: Family Medicine

## 2021-07-17 ENCOUNTER — Other Ambulatory Visit: Payer: Self-pay

## 2021-07-17 VITALS — BP 104/56 | HR 95 | Wt 169.0 lb

## 2021-07-17 DIAGNOSIS — Z3480 Encounter for supervision of other normal pregnancy, unspecified trimester: Secondary | ICD-10-CM | POA: Insufficient documentation

## 2021-07-17 DIAGNOSIS — O34219 Maternal care for unspecified type scar from previous cesarean delivery: Secondary | ICD-10-CM

## 2021-07-17 LAB — OB RESULTS CONSOLE GC/CHLAMYDIA: Gonorrhea: NEGATIVE

## 2021-07-17 NOTE — Progress Notes (Signed)
   PRENATAL VISIT NOTE  Subjective:  Kristin Foster is a 25 y.o. G3P1011 at [redacted]w[redacted]d being seen today for ongoing prenatal care.  She is currently monitored for the following issues for this low-risk pregnancy and has Supervision of other normal pregnancy, antepartum; Previous cesarean delivery affecting pregnancy, antepartum; and Fatty liver in mother during pregnancy on their problem list.  Patient reports no complaints.  Contractions: Irritability. Vag. Bleeding: None.  Movement: Present. Denies leaking of fluid.   The following portions of the patient's history were reviewed and updated as appropriate: allergies, current medications, past family history, past medical history, past social history, past surgical history and problem list.   Objective:   Vitals:   07/17/21 1104  BP: (!) 104/56  Pulse: 95  Weight: 169 lb (76.7 kg)    Fetal Status: Fetal Heart Rate (bpm): 167   Movement: Present     General:  Alert, oriented and cooperative. Patient is in no acute distress.  Skin: Skin is warm and dry. No rash noted.   Cardiovascular: Normal heart rate noted  Respiratory: Normal respiratory effort, no problems with respiration noted  Abdomen: Soft, gravid, appropriate for gestational age.  Pain/Pressure: Present     Pelvic: Cervical exam performed in the presence of a chaperone, did not ceck cervix, gave scalp stim with fetal movement in response        Extremities: Normal range of motion.  Edema: Trace  Mental Status: Normal mood and affect. Normal behavior. Normal judgment and thought content.   Assessment and Plan:  Pregnancy: G3P1011 at [redacted]w[redacted]d 1. Previous cesarean delivery affecting pregnancy, antepartum Scheduled for [facility]  2. Supervision of other normal pregnancy, antepartum Cultures today Reports some less movement, but still with movement. + scalp stim - Culture, beta strep (group b only) - Cervicovaginal ancillary only( [place])  Term labor symptoms and general obstetric  precautions including but not limited to vaginal bleeding, contractions, leaking of fluid and fetal movement were reviewed in detail with the patient. Please refer to After Visit Summary for other counseling recommendations.   Return in 1 week (on 07/24/2021).  Future Appointments  Date Time Provider Department Center  07/23/2021  3:15 PM Noralee [facility]  07/31/2021 10:15 AM Anyanwu, Jethro Bastos, MD [facility]    Reva Bores, MD

## 2021-07-17 NOTE — Patient Instructions (Signed)

## 2021-07-18 LAB — CERVICOVAGINAL ANCILLARY ONLY
Bacterial Vaginitis (gardnerella): NEGATIVE
Candida Glabrata: NEGATIVE
Candida Vaginitis: NEGATIVE
Chlamydia: NEGATIVE
Comment: NEGATIVE
Comment: NEGATIVE
Comment: NEGATIVE
Comment: NEGATIVE
Comment: NEGATIVE
Comment: NORMAL
Neisseria Gonorrhea: NEGATIVE
Trichomonas: NEGATIVE

## 2021-07-19 ENCOUNTER — Encounter (HOSPITAL_COMMUNITY): Payer: Self-pay | Admitting: Family Medicine

## 2021-07-19 ENCOUNTER — Other Ambulatory Visit: Payer: Self-pay

## 2021-07-19 ENCOUNTER — Inpatient Hospital Stay (HOSPITAL_COMMUNITY)
Admission: AD | Admit: 2021-07-19 | Discharge: 2021-07-19 | Disposition: A | Discharge status: Home / Self Care | Payer: Medicaid Other | Attending: Family Medicine | Admitting: Family Medicine

## 2021-07-19 DIAGNOSIS — O368130 Decreased fetal movements, third trimester, not applicable or unspecified: Secondary | ICD-10-CM

## 2021-07-19 DIAGNOSIS — O26893 Other specified pregnancy related conditions, third trimester: Secondary | ICD-10-CM | POA: Insufficient documentation

## 2021-07-19 DIAGNOSIS — Z3689 Encounter for other specified antenatal screening: Secondary | ICD-10-CM | POA: Diagnosis not present

## 2021-07-19 DIAGNOSIS — Z3A37 37 weeks gestation of pregnancy: Secondary | ICD-10-CM | POA: Diagnosis not present

## 2021-07-19 DIAGNOSIS — O368190 Decreased fetal movements, unspecified trimester, not applicable or unspecified: Secondary | ICD-10-CM | POA: Insufficient documentation

## 2021-07-19 DIAGNOSIS — R102 Pelvic and perineal pain: Secondary | ICD-10-CM

## 2021-07-19 DIAGNOSIS — M549 Dorsalgia, unspecified: Secondary | ICD-10-CM | POA: Diagnosis not present

## 2021-07-19 LAB — URINALYSIS, ROUTINE W REFLEX MICROSCOPIC
Bilirubin Urine: NEGATIVE
Glucose, UA: NEGATIVE mg/dL
Hgb urine dipstick: NEGATIVE
Ketones, ur: NEGATIVE mg/dL
Leukocytes,Ua: NEGATIVE
Nitrite: NEGATIVE
Protein, ur: NEGATIVE mg/dL
Specific Gravity, Urine: 1.012 (ref 1.005–1.030)
pH: 7 (ref 5.0–8.0)

## 2021-07-19 MED ORDER — ACETAMINOPHEN 500 MG PO TABS
1000.0000 mg | ORAL_TABLET | Freq: Once | ORAL | Status: AC
  Administered 2021-07-19: 1000 mg via ORAL
  Filled 2021-07-19: qty 2

## 2021-07-19 MED ORDER — CYCLOBENZAPRINE HCL 5 MG PO TABS
10.0000 mg | ORAL_TABLET | Freq: Once | ORAL | Status: AC
  Administered 2021-07-19: 10 mg via ORAL
  Filled 2021-07-19: qty 2

## 2021-07-19 MED ORDER — CYCLOBENZAPRINE HCL 7.5 MG PO TABS: 7.5000 mg | ORAL_TABLET | Freq: Three times a day (TID) | ORAL | 0 refills | Status: DC | PRN

## 2021-07-19 NOTE — MAU Provider Note (Signed)
History     CSN: [id number]  Arrival date and time: 07/19/21 1128   Event Date/Time   First Provider Initiated Contact with Patient 07/19/21 1221      Chief Complaint  Patient presents with   Abdominal Pain   Decreased Fetal Movement   HPI Kristin Foster is a 25 y.o. G3P1011 at [redacted]w[redacted]d who presents with abdominal pain & decreased fetal movement.  Reports suprapubic pain that radiates to her vagina. Pain started over a month ago but worsened yesterday. Pain is constant but worse with movement. Initially was recommended a pregnancy support belt but she stopped wearing it b/c she felt that it didn't help. Was told about exercises to help her pain but states it hurt to perform the exercises so she didn't do them. Rates pain 9/10. Hasn't treated symptoms. Denies dysuria, hematuria, vaginal bleeding, fever, or LOF.  Reports a decrease in fetal movement for the last week. Still feels baby move just not as much.   OB History     Gravida  3   Para  1   Term  1   Preterm  0   AB  1   Living  1      SAB  1   IAB  0   Ectopic  0   Multiple  0   Live Births  1           Past Medical History:  Diagnosis Date   Migraines     Past Surgical History:  Procedure Laterality Date   CESAREAN SECTION      Family History  Problem Relation Age of Onset   Hypertension Maternal Grandfather     Social History   Tobacco Use   Smoking status: Every Day    Types: E-cigarettes   Smokeless tobacco: Never  Vaping Use   Vaping Use: Every day   Substances: Nicotine  Substance Use Topics   Alcohol use: Never   Drug use: Never    Allergies: No Known Allergies  No medications prior to admission.    Review of Systems  Constitutional: Negative.   Gastrointestinal:  Positive for abdominal pain. Negative for diarrhea, nausea and vomiting.  Genitourinary:  Positive for pelvic pain and vaginal pain. Negative for dysuria, vaginal bleeding and vaginal discharge.   Musculoskeletal:  Positive for back pain.  Physical Exam   Blood pressure 112/71, pulse 92, temperature 98.4 F (36.9 C), temperature source Oral, resp. rate 18, weight 77.2 kg, last menstrual period 10/30/2020, SpO2 100 %.  Physical Exam Vitals and nursing note reviewed. Exam conducted with a chaperone present.  Constitutional:      General: She is not in acute distress.    Appearance: She is well-developed. She is not ill-appearing.  HENT:     Head: Normocephalic and atraumatic.  Eyes:     General: No scleral icterus. Pulmonary:     Effort: Pulmonary effort is normal. No respiratory distress.  Abdominal:     Palpations: Abdomen is soft.     Tenderness: There is no abdominal tenderness.  Genitourinary:    Comments: Dilation: Fingertip Exam by:: J.Bellamy, RN  Skin:    General: Skin is warm and dry.  Neurological:     General: No focal deficit present.     Mental Status: She is alert.  Psychiatric:        Mood and Affect: Mood normal.        Behavior: Behavior normal.   NST:  Baseline: 140 bpm, Variability: Good {> 6  bpm), Accelerations: Reactive, Decelerations: Absent, and irregular contractions  MAU Course  Procedures Results for orders placed or performed during the hospital encounter of 07/19/21 (from the past 24 hour(s))  Urinalysis, Routine w reflex microscopic Urine, Clean Catch     Status: None   Collection Time: 07/19/21 12:09 PM  Result Value Ref Range   Color, Urine YELLOW YELLOW   APPearance CLEAR CLEAR   Specific Gravity, Urine 1.012 1.005 - 1.030   pH 7.0 5.0 - 8.0   Glucose, UA NEGATIVE NEGATIVE mg/dL   Hgb urine dipstick NEGATIVE NEGATIVE   Bilirubin Urine NEGATIVE NEGATIVE   Ketones, ur NEGATIVE NEGATIVE mg/dL   Protein, ur NEGATIVE NEGATIVE mg/dL   Nitrite NEGATIVE NEGATIVE   Leukocytes,Ua NEGATIVE NEGATIVE    MDM Patient presents with ongoing pelvic pain & decreased fetal movement. Cervix is fingertip/thick per RN exam. She has irregular  contractions on the monitor. Pain somewhat improved with tylenol & flexeril given in MAU.  Reactive NST & patient documented feeling 17 movements within 1 hour.   Assessment and Plan   1. Pelvic pain affecting pregnancy in third trimester, antepartum  -rx flexeril -tylenol prn -continue maternity support belt -reviewed reasons to return to MAU  2. Decreased fetal movements in third trimester, single or unspecified fetus  -reviewed fetal kick counts & reasons to return to MAU  3. NST (non-stress test) reactive   4. [redacted] weeks gestation of pregnancy      Jorje Guild 07/19/2021, 6:43 PM

## 2021-07-19 NOTE — MAU Note (Addendum)
Pt reports to MAU with c/o abdominal pain that started yesterday in the lower abd and groin area.  Pt states that she has been having irregular cx's that are intermittent.  Pt also reports that she has not felt the baby move as much as she has last week since Wednesday however is feeling baby move daily just not as much as before.  No LOF or vaginal discharge.

## 2021-07-20 LAB — CULTURE, BETA STREP (GROUP B ONLY): Strep Gp B Culture: NEGATIVE

## 2021-07-23 ENCOUNTER — Ambulatory Visit (INDEPENDENT_AMBULATORY_CARE_PROVIDER_SITE_OTHER): Payer: Medicaid Other | Admitting: Family Medicine

## 2021-07-23 ENCOUNTER — Other Ambulatory Visit: Payer: Self-pay

## 2021-07-23 VITALS — BP 103/70 | HR 90 | Wt 169.0 lb

## 2021-07-23 DIAGNOSIS — O34219 Maternal care for unspecified type scar from previous cesarean delivery: Secondary | ICD-10-CM

## 2021-07-23 DIAGNOSIS — O26619 Liver and biliary tract disorders in pregnancy, unspecified trimester: Secondary | ICD-10-CM

## 2021-07-23 DIAGNOSIS — Z3480 Encounter for supervision of other normal pregnancy, unspecified trimester: Secondary | ICD-10-CM

## 2021-07-23 DIAGNOSIS — K760 Fatty (change of) liver, not elsewhere classified: Secondary | ICD-10-CM

## 2021-07-23 MED ORDER — ONDANSETRON 4 MG PO TBDP: 4.0000 mg | ORAL_TABLET | Freq: Four times a day (QID) | ORAL | 0 refills | Status: DC | PRN

## 2021-07-23 NOTE — Progress Notes (Signed)
   PRENATAL VISIT NOTE  Subjective:  Kristin Foster is a 25 y.o. G3P1011 at [redacted]w[redacted]d being seen today for ongoing prenatal care.  She is currently monitored for the following issues for this low-risk pregnancy and has Supervision of other normal pregnancy, antepartum; Previous cesarean delivery affecting pregnancy, antepartum; and Fatty liver in mother during pregnancy on their problem list.  Patient reports nausea.  Contractions: Irritability. Vag. Bleeding: None.  Movement: Present. Denies leaking of fluid.   The following portions of the patient's history were reviewed and updated as appropriate: allergies, current medications, past family history, past medical history, past social history, past surgical history and problem list.   Objective:   Vitals:   07/23/21 1535  BP: 103/70  Pulse: 90  Weight: 169 lb (76.7 kg)    Fetal Status: Fetal Heart Rate (bpm): 147 Fundal Height: 38 cm Movement: Present  Presentation: Vertex  General:  Alert, oriented and cooperative. Patient is in no acute distress.  Skin: Skin is warm and dry. No rash noted.   Cardiovascular: Normal heart rate noted  Respiratory: Normal respiratory effort, no problems with respiration noted  Abdomen: Soft, gravid, appropriate for gestational age.  Pain/Pressure: Present     Pelvic: Cervical exam performed in the presence of a chaperone Dilation: Fingertip Effacement (%): Thick    Extremities: Normal range of motion.  Edema: Trace  Mental Status: Normal mood and affect. Normal behavior. Normal judgment and thought content.   Assessment and Plan:  Pregnancy: G3P1011 at [redacted]w[redacted]d 1. Supervision of other normal pregnancy, antepartum FHT and FH normal. Nauseated - will give zofran. Patient wanted cervix checked, but prefers female provider. Dr Macon Large checked patient's cervix.   2. Previous cesarean delivery affecting pregnancy, antepartum Scheduled for repeat cesarean on 12/2.  3. Fatty liver in mother during  pregnancy   Term labor symptoms and general obstetric precautions including but not limited to vaginal bleeding, contractions, leaking of fluid and fetal movement were reviewed in detail with the patient. Please refer to After Visit Summary for other counseling recommendations.   No follow-ups on file.  Future Appointments  Date Time Provider Department Center  07/31/2021 10:15 AM Anyanwu, Jethro Bastos, MD [facility]    Levie Heritage, DO

## 2021-07-31 ENCOUNTER — Other Ambulatory Visit: Payer: Self-pay | Admitting: Certified Nurse Midwife

## 2021-07-31 ENCOUNTER — Other Ambulatory Visit: Payer: Self-pay

## 2021-07-31 ENCOUNTER — Telehealth: Payer: Self-pay | Admitting: *Deleted

## 2021-07-31 ENCOUNTER — Encounter (HOSPITAL_COMMUNITY): Payer: Self-pay | Admitting: Obstetrics and Gynecology

## 2021-07-31 ENCOUNTER — Inpatient Hospital Stay (HOSPITAL_COMMUNITY)
Admission: AD | Admit: 2021-07-31 | Discharge: 2021-07-31 | Disposition: A | Discharge status: Home / Self Care | Payer: Medicaid Other | Attending: Obstetrics and Gynecology | Admitting: Obstetrics and Gynecology

## 2021-07-31 ENCOUNTER — Encounter: Payer: Medicaid Other | Admitting: Obstetrics & Gynecology

## 2021-07-31 DIAGNOSIS — Z3689 Encounter for other specified antenatal screening: Secondary | ICD-10-CM | POA: Diagnosis not present

## 2021-07-31 DIAGNOSIS — O34219 Maternal care for unspecified type scar from previous cesarean delivery: Secondary | ICD-10-CM

## 2021-07-31 DIAGNOSIS — Z3A39 39 weeks gestation of pregnancy: Secondary | ICD-10-CM

## 2021-07-31 DIAGNOSIS — Z3493 Encounter for supervision of normal pregnancy, unspecified, third trimester: Secondary | ICD-10-CM | POA: Diagnosis present

## 2021-07-31 DIAGNOSIS — Z3480 Encounter for supervision of other normal pregnancy, unspecified trimester: Secondary | ICD-10-CM

## 2021-07-31 LAB — POCT FERN TEST: POCT Fern Test: NEGATIVE

## 2021-07-31 NOTE — MAU Provider Note (Signed)
S: Ms. Kristin Foster is a 25 y.o. G3P1011 at [redacted]w[redacted]d  who presents to MAU today complaining of leaking of fluid since 07/29/21 in small amounts that wet her underwear. She denies vaginal bleeding. She endorses irregular contractions. She reports normal fetal movement.    Has a rCS scheduled for tomorrow but verbalized to the RN that she may want to TOLAC instead.  O: BP 108/70 (BP Location: Right Arm)   Pulse (!) 101   Temp 98.3 F (36.8 C) (Oral)   Resp 19   LMP 10/30/2020   SpO2 99%  GENERAL: Well-developed, well-nourished female in no acute distress.  HEAD: Normocephalic, atraumatic.  CHEST: Normal effort of breathing, regular heart rate ABDOMEN: Soft, nontender, gravid PELVIC: Normal external female genitalia. Vagina is pink and rugated. Cervix with normal contour, no lesions. Normal discharge.  No pooling.   Cervical exam: deferred since not actively laboring  Fetal Monitoring: reactive Baseline: 145 Variability: moderate Accelerations: 15x15 Decelerations: none Contractions: irregular, mostly UI  Results for orders placed or performed during the hospital encounter of 07/31/21 (from the past 24 hour(s))  POCT fern test     Status: None   Collection Time: 07/31/21  3:12 PM  Result Value Ref Range   POCT Fern Test Negative = intact amniotic membranes    During second fern slide collection, pt verbalized to CNM that she is interested in not doing the rCS tomorrow and wants to discuss TOLAC. Answered many questions about risks and benefits, etc. Pt stated she desires to [facility] with a low tolerance for long labor or long pushing stage. Reviewed chart and did not find CS or TOLAC consent so Dr. Crissie Reese consented her for [facility] and I scheduled her IOL for the morning of 08/04/21. Orders placed and CS canceled.  A: SIUP at [redacted]w[redacted]d  Membranes intact NST reactive  P: Discharge home in stable conditions with labor precautions  Advised to wait at home on 12/5, L&D will call when they have  a room available  Bernerd Limbo, CNM 07/31/2021 4:26 PM

## 2021-07-31 NOTE — Progress Notes (Signed)
rCS for 08/01/21 at 0915 canceled per patient request, IOL for TOLAC requested and consented on 07/31/21 in MAU. Edd Arbour, CNM, MSN, IBCLC Certified Nurse Midwife, [facility]

## 2021-07-31 NOTE — MAU Note (Addendum)
...  Kristin Foster is a 25 y.o. at [redacted]w[redacted]d here in MAU reporting: CTX for two days and states they are irregular. She states she has also been leaking clear fluids for the past two days that gets her underwear wet. +FM. No VB.   Scheduled for C/S tomorrow at 0930. Patient states she does not know if she is supposed to go anywhere for her pre admission lab work and COVID swab. Patient scheduled for an appointment earlier this morning but patient states her MD at her last visit stated she did not need to go. Patient states she called the office back after they tried to reach her and she states they did not answer so she decided to come here.  Pain score: 5/10 lower abdomen  Lab orders placed from triage: MAU Labor Eval

## 2021-07-31 NOTE — Telephone Encounter (Signed)
Patient called front desk today and asked for nurse to call her about questions about admission tomorrow. I called Zoelle and left a message I was calling her back to discuss her question and that I see she has appointment with [place] today and may discuss then , or if needed call back later. Payson Crumby,RN

## 2021-08-01 ENCOUNTER — Other Ambulatory Visit: Payer: Self-pay | Admitting: Advanced Practice Midwife

## 2021-08-01 ENCOUNTER — Encounter (HOSPITAL_COMMUNITY): Admission: RE | Payer: Self-pay | Source: Home / Self Care

## 2021-08-01 ENCOUNTER — Inpatient Hospital Stay (HOSPITAL_COMMUNITY): Admission: RE | Admit: 2021-08-01 | Payer: Medicaid Other | Source: Home / Self Care | Admitting: Family Medicine

## 2021-08-01 DIAGNOSIS — Z3480 Encounter for supervision of other normal pregnancy, unspecified trimester: Secondary | ICD-10-CM

## 2021-08-01 DIAGNOSIS — O34219 Maternal care for unspecified type scar from previous cesarean delivery: Secondary | ICD-10-CM

## 2021-08-01 SURGERY — Surgical Case
Anesthesia: Regional

## 2021-08-04 ENCOUNTER — Inpatient Hospital Stay (HOSPITAL_COMMUNITY): Payer: Medicaid Other

## 2021-08-05 NOTE — Progress Notes (Signed)
Patient called to come in for IOL. Left message and asked her to return my phone call. Number called was [phone]. Victorino Dike Mbugua attempted to call patient on PM shift and patient also did not answer phone.

## 2021-08-05 NOTE — Progress Notes (Signed)
The patient returned call at 1100 this morning. Requested patient come in now for IOL. Pt stated she cannot come now due to transportation issues. Asked if patient could be here at 1pm. Patient stated she would try. The patient called back at 1256 and said she would not be able to make the 1pm IOL appointment. States she will call when she has transportation available. I instructed the patient to call to L&D when she has transportation and we would try to accommodate her.

## 2021-08-05 NOTE — Progress Notes (Signed)
Patient has not returned call. Dr. Mathis Fare notified the patient was a scheduled IOL yesterday and was called to come but stated she could not get to the hospital for 3 hours. The patient was called by the [facility] [place] last night and did not get answer and I called the patient around 0800 this morning and left the patient a message to call L&D. Plan is to try the patient again this afternoon and see if we can get in touch with her.

## 2021-08-08 ENCOUNTER — Inpatient Hospital Stay (HOSPITAL_COMMUNITY): Payer: Medicaid Other

## 2021-08-08 ENCOUNTER — Inpatient Hospital Stay (HOSPITAL_COMMUNITY)
Admission: AD | Admit: 2021-08-08 | Discharge: 2021-08-12 | DRG: 787 | Disposition: A | Discharge status: Home / Self Care | Payer: Medicaid Other | Attending: Obstetrics and Gynecology | Admitting: Obstetrics and Gynecology

## 2021-08-08 ENCOUNTER — Encounter (HOSPITAL_COMMUNITY): Payer: Self-pay | Admitting: Family Medicine

## 2021-08-08 ENCOUNTER — Other Ambulatory Visit: Payer: Self-pay

## 2021-08-08 DIAGNOSIS — Z20822 Contact with and (suspected) exposure to covid-19: Secondary | ICD-10-CM | POA: Diagnosis present

## 2021-08-08 DIAGNOSIS — O321XX0 Maternal care for breech presentation, not applicable or unspecified: Secondary | ICD-10-CM | POA: Diagnosis present

## 2021-08-08 DIAGNOSIS — D62 Acute posthemorrhagic anemia: Secondary | ICD-10-CM | POA: Diagnosis not present

## 2021-08-08 DIAGNOSIS — R339 Retention of urine, unspecified: Secondary | ICD-10-CM | POA: Diagnosis not present

## 2021-08-08 DIAGNOSIS — O9081 Anemia of the puerperium: Secondary | ICD-10-CM | POA: Diagnosis not present

## 2021-08-08 DIAGNOSIS — O34211 Maternal care for low transverse scar from previous cesarean delivery: Principal | ICD-10-CM | POA: Diagnosis present

## 2021-08-08 DIAGNOSIS — F17290 Nicotine dependence, other tobacco product, uncomplicated: Secondary | ICD-10-CM | POA: Diagnosis present

## 2021-08-08 DIAGNOSIS — O99334 Smoking (tobacco) complicating childbirth: Secondary | ICD-10-CM | POA: Diagnosis present

## 2021-08-08 DIAGNOSIS — O99893 Other specified diseases and conditions complicating puerperium: Secondary | ICD-10-CM | POA: Diagnosis not present

## 2021-08-08 DIAGNOSIS — Z3480 Encounter for supervision of other normal pregnancy, unspecified trimester: Secondary | ICD-10-CM

## 2021-08-08 DIAGNOSIS — Z3A40 40 weeks gestation of pregnancy: Secondary | ICD-10-CM | POA: Diagnosis not present

## 2021-08-08 DIAGNOSIS — O411230 Chorioamnionitis, third trimester, not applicable or unspecified: Secondary | ICD-10-CM | POA: Diagnosis not present

## 2021-08-08 DIAGNOSIS — O34219 Maternal care for unspecified type scar from previous cesarean delivery: Secondary | ICD-10-CM | POA: Diagnosis present

## 2021-08-08 DIAGNOSIS — O324XX0 Maternal care for high head at term, not applicable or unspecified: Secondary | ICD-10-CM | POA: Diagnosis present

## 2021-08-08 LAB — CBC
HCT: 38.7 % (ref 36.0–46.0)
Hemoglobin: 13.2 g/dL (ref 12.0–15.0)
MCH: 30.8 pg (ref 26.0–34.0)
MCHC: 34.1 g/dL (ref 30.0–36.0)
MCV: 90.4 fL (ref 80.0–100.0)
Platelets: 232 10*3/uL (ref 150–400)
RBC: 4.28 MIL/uL (ref 3.87–5.11)
RDW: 12.4 % (ref 11.5–15.5)
WBC: 8.3 10*3/uL (ref 4.0–10.5)
nRBC: 0 % (ref 0.0–0.2)

## 2021-08-08 LAB — RESP PANEL BY RT-PCR (FLU A&B, COVID) ARPGX2
Influenza A by PCR: NEGATIVE
Influenza B by PCR: NEGATIVE
SARS Coronavirus 2 by RT PCR: NEGATIVE

## 2021-08-08 LAB — TYPE AND SCREEN
ABO/RH(D): O POS
Antibody Screen: NEGATIVE

## 2021-08-08 MED ORDER — OXYCODONE-ACETAMINOPHEN 5-325 MG PO TABS: 1.0000 | ORAL_TABLET | ORAL | Status: DC | PRN

## 2021-08-08 MED ORDER — LACTATED RINGERS IV SOLN
500.0000 mL | INTRAVENOUS | Status: DC | PRN
  Administered 2021-08-09: 500 mL via INTRAVENOUS

## 2021-08-08 MED ORDER — OXYCODONE-ACETAMINOPHEN 5-325 MG PO TABS: 2.0000 | ORAL_TABLET | ORAL | Status: DC | PRN

## 2021-08-08 MED ORDER — LIDOCAINE HCL (PF) 1 % IJ SOLN: 30.0000 mL | INTRAMUSCULAR | Status: DC | PRN

## 2021-08-08 MED ORDER — OXYTOCIN-SODIUM CHLORIDE 30-0.9 UT/500ML-% IV SOLN
2.5000 [IU]/h | INTRAVENOUS | Status: DC
  Filled 2021-08-08: qty 500

## 2021-08-08 MED ORDER — OXYTOCIN-SODIUM CHLORIDE 30-0.9 UT/500ML-% IV SOLN
1.0000 m[IU]/min | INTRAVENOUS | Status: DC
  Administered 2021-08-08: 2 m[IU]/min via INTRAVENOUS

## 2021-08-08 MED ORDER — ACETAMINOPHEN 325 MG PO TABS
650.0000 mg | ORAL_TABLET | ORAL | Status: DC | PRN
  Administered 2021-08-08 – 2021-08-09 (×2): 650 mg via ORAL
  Filled 2021-08-08 (×3): qty 2

## 2021-08-08 MED ORDER — SOD CITRATE-CITRIC ACID 500-334 MG/5ML PO SOLN
30.0000 mL | ORAL | Status: DC | PRN
  Administered 2021-08-08 – 2021-08-09 (×2): 30 mL via ORAL
  Filled 2021-08-08 (×2): qty 30

## 2021-08-08 MED ORDER — FENTANYL CITRATE (PF) 100 MCG/2ML IJ SOLN
100.0000 ug | INTRAMUSCULAR | Status: DC | PRN
  Administered 2021-08-08 – 2021-08-09 (×2): 100 ug via INTRAVENOUS
  Filled 2021-08-08 (×2): qty 2

## 2021-08-08 MED ORDER — LACTATED RINGERS IV SOLN: INTRAVENOUS | Status: DC

## 2021-08-08 MED ORDER — DIPHENHYDRAMINE HCL 50 MG/ML IJ SOLN
12.5000 mg | Freq: Once | INTRAMUSCULAR | Status: AC
  Administered 2021-08-08: 12.5 mg via INTRAVENOUS
  Filled 2021-08-08: qty 1

## 2021-08-08 MED ORDER — OXYTOCIN-SODIUM CHLORIDE 30-0.9 UT/500ML-% IV SOLN: 1.0000 m[IU]/min | INTRAVENOUS | Status: DC

## 2021-08-08 MED ORDER — OXYTOCIN BOLUS FROM INFUSION: 333.0000 mL | Freq: Once | INTRAVENOUS | Status: DC

## 2021-08-08 MED ORDER — TERBUTALINE SULFATE 1 MG/ML IJ SOLN: 0.2500 mg | Freq: Once | INTRAMUSCULAR | Status: DC | PRN

## 2021-08-08 MED ORDER — ONDANSETRON HCL 4 MG/2ML IJ SOLN: 4.0000 mg | Freq: Four times a day (QID) | INTRAMUSCULAR | Status: DC | PRN

## 2021-08-08 NOTE — Progress Notes (Signed)
Attempted to start pitocin per order after patient ate snack. Patient reports she has not eaten snack at this time and would like 30 minutes more until pitocin is started.

## 2021-08-08 NOTE — H&P (Addendum)
OBSTETRIC ADMISSION HISTORY AND PHYSICAL  Kristin Foster is a 25 y.o. female G3P1011 with IUP at [redacted]w[redacted]d by LMP presenting for eIOL/TOLAC. Previous CS due to arrest of second stage and chorioamnionitis, reports she pushed for 7 hours. Most of her prenatal care this pregnancy was done in [place]. She reports +FMs, No LOF, no VB, no blurry vision, headaches or peripheral edema, and RUQ pain.  She plans on breast feeding. She request POPs for birth control. She received her prenatal care at [facility]  Dating: By LMP --->  Estimated Date of Delivery: 08/06/21  Sono:    @[redacted]w[redacted]d , CWD, normal anatomy, cephalic presentation, posterior placental lie, 2666g, 34% EFW   Prenatal History/Complications:  Previous Cesarean delivery Fatty liver in pregnancy   Past Medical History: Past Medical History:  Diagnosis Date   Migraines     Past Surgical History: Past Surgical History:  Procedure Laterality Date   CESAREAN SECTION      Obstetrical History: OB History     Gravida  3   Para  1   Term  1   Preterm  0   AB  1   Living  1      SAB  1   IAB  0   Ectopic  0   Multiple  0   Live Births  1           Social History Social History   Socioeconomic History   Marital status: Married    Spouse name: Not on file   Number of children: 1   Years of education: Not on file   Highest education level: Not on file  Occupational History   Not on file  Tobacco Use   Smoking status: Every Day    Types: E-cigarettes   Smokeless tobacco: Never  Vaping Use   Vaping Use: Every day   Substances: Nicotine  Substance and Sexual Activity   Alcohol use: Never   Drug use: Never   Sexual activity: Yes    Birth control/protection: None  Other Topics Concern   Not on file  Social History Narrative   Not on file   Social Determinants of Health   Financial Resource Strain: Not on file  Food Insecurity: No Food Insecurity   Worried About Running Out of Food in the Last Year: Never  true   Ran Out of Food in the Last Year: Never true  Transportation Needs: No Transportation Needs   Lack of Transportation (Medical): No   Lack of Transportation (Non-Medical): No  Physical Activity: Not on file  Stress: Not on file  Social Connections: Not on file    Family History: Family History  Problem Relation Age of Onset   Hypertension Maternal Grandfather     Allergies: No Known Allergies  Medications Prior to Admission  Medication Sig Dispense Refill Last Dose   acetaminophen (TYLENOL) 500 MG tablet Take 1,000 mg by mouth daily as needed for moderate pain.      Prenatal Vit-Fe Fumarate-FA (PRENATAL VITAMINS PO) Take 1 tablet by mouth daily.        Review of Systems   All systems reviewed and negative except as stated in HPI  Blood pressure 107/76, pulse (!) 103, temperature 98 F (36.7 C), temperature source Oral, resp. rate 18, height 5' (1.524 m), weight 76.3 kg, last menstrual period 10/30/2020. General appearance: alert, cooperative, and no distress Lungs: no increased work of breathing Heart: warm and well perfused Abdomen: soft, non-tender; gravid uterus Extremities: No edema or  calf tenderness. Presentation: cephalic Fetal monitoring: 150/mod/+accels/no decels Uterine activity: present, irregular Dilation: 2 Effacement (%): Thick Station: -3, -2 Exam by:: Cletus Gash, RN   Prenatal labs: ABO, Rh: --/--/O POS (12/09 1655) Antibody: NEG (12/09 1655) Rubella: 1.28 (09/28 1037) RPR: Non Reactive (09/28 1037)  HBsAg: Negative (09/28 1037)  HIV: Non Reactive (09/28 1037)  GBS: Negative/-- (11/17 1159)  2 hr Glucola, 3rd trimester: Normal Genetic screening: LR Female, negative Horizon x4 Anatomy US: Normal  Prenatal Transfer Tool  Maternal Diabetes: No Genetic Screening: Normal Maternal Ultrasounds/Referrals: Normal Fetal Ultrasounds or other Referrals:  None Maternal Substance Abuse:  No Significant Maternal Medications:  None Significant  Maternal Lab Results: Group B Strep negative  Results for orders placed or performed during the hospital encounter of 08/08/21 (from the past 24 hour(s))  Resp Panel by RT-PCR (Flu A&B, Covid) Nasopharyngeal Swab   Collection Time: 08/08/21  4:22 PM   Specimen: Nasopharyngeal Swab; Nasopharyngeal(NP) swabs in vial transport medium  Result Value Ref Range   SARS Coronavirus 2 by RT PCR NEGATIVE NEGATIVE   Influenza A by PCR NEGATIVE NEGATIVE   Influenza B by PCR NEGATIVE NEGATIVE  Type and screen   Collection Time: 08/08/21  4:55 PM  Result Value Ref Range   ABO/RH(D) O POS    Antibody Screen NEG    Sample Expiration      08/11/2021,2359 Performed at [facility], [place] [address] [place], [place] [address]   CBC   Collection Time: 08/08/21  5:00 PM  Result Value Ref Range   WBC 8.3 4.0 - 10.5 K/uL   RBC 4.28 3.87 - 5.11 MIL/uL   Hemoglobin 13.2 12.0 - 15.0 g/dL   HCT 38.7 36.0 - 46.0 %   MCV 90.4 80.0 - 100.0 fL   MCH 30.8 26.0 - 34.0 pg   MCHC 34.1 30.0 - 36.0 g/dL   RDW 12.4 11.5 - 15.5 %   Platelets 232 150 - 400 K/uL   nRBC 0.0 0.0 - 0.2 %    Patient Active Problem List   Diagnosis Date Noted   Fatty liver in mother during pregnancy 06/12/2021   Supervision of other normal pregnancy, antepartum 05/28/2021   Previous cesarean delivery affecting pregnancy, antepartum 05/28/2021    Assessment/Plan:  Kristin Foster is a 25 y.o. G3P1011 at [redacted]w[redacted]d here for eIOL/TOLAC  #Labor/TOLAC: Will start induction with pitocin. After long discussion, pt declines FB at this time but may reconsider later if cervix unchanged. Will reassess in 4 hours. Consent for TOLAC has already been signed, she is aware of the risks and would like to proceed for now. She is additionally aware of expected time with an induction.  #Pain: PRN, plans for epidural  #FWB: Cat 1 #ID: GBS neg #MOF: breast #MOC: POPs #Circ: yes  Greig Right, MD, PGY-1  08/08/2021   GME ATTESTATION:  I saw and  evaluated the patient. I agree with the findings and the plan of care as documented in the resident's note. My edits are added.   Darrelyn Hillock, DO OB Fellow, [place] [facility] [place] 08/08/2021 7:54 PM

## 2021-08-09 ENCOUNTER — Inpatient Hospital Stay (HOSPITAL_COMMUNITY): Payer: Medicaid Other | Admitting: Anesthesiology

## 2021-08-09 ENCOUNTER — Encounter (HOSPITAL_COMMUNITY): Payer: Self-pay | Admitting: Family Medicine

## 2021-08-09 ENCOUNTER — Encounter (HOSPITAL_COMMUNITY)
Admission: AD | Disposition: A | Discharge status: Home / Self Care | Payer: Self-pay | Source: Home / Self Care | Attending: Obstetrics and Gynecology

## 2021-08-09 DIAGNOSIS — O411230 Chorioamnionitis, third trimester, not applicable or unspecified: Secondary | ICD-10-CM

## 2021-08-09 DIAGNOSIS — Z3A40 40 weeks gestation of pregnancy: Secondary | ICD-10-CM

## 2021-08-09 DIAGNOSIS — O34211 Maternal care for low transverse scar from previous cesarean delivery: Secondary | ICD-10-CM

## 2021-08-09 LAB — RPR: RPR Ser Ql: NONREACTIVE

## 2021-08-09 SURGERY — Surgical Case
Anesthesia: Epidural

## 2021-08-09 MED ORDER — MORPHINE SULFATE (PF) 0.5 MG/ML IJ SOLN
INTRAMUSCULAR | Status: DC | PRN
  Administered 2021-08-09: 3 mg via EPIDURAL

## 2021-08-09 MED ORDER — FENTANYL-BUPIVACAINE-NACL 0.5-0.125-0.9 MG/250ML-% EP SOLN
EPIDURAL | Status: AC
  Filled 2021-08-09: qty 250

## 2021-08-09 MED ORDER — LACTATED RINGERS IV SOLN
500.0000 mL | Freq: Once | INTRAVENOUS | Status: AC
  Administered 2021-08-09: 500 mL via INTRAVENOUS

## 2021-08-09 MED ORDER — SODIUM CHLORIDE 0.9 % IV SOLN
500.0000 mg | INTRAVENOUS | Status: AC
  Administered 2021-08-09: 500 mg via INTRAVENOUS

## 2021-08-09 MED ORDER — OXYTOCIN-SODIUM CHLORIDE 30-0.9 UT/500ML-% IV SOLN
INTRAVENOUS | Status: DC | PRN
  Administered 2021-08-09: 300 mL via INTRAVENOUS

## 2021-08-09 MED ORDER — SIMETHICONE 80 MG PO CHEW: 80.0000 mg | CHEWABLE_TABLET | ORAL | Status: DC | PRN

## 2021-08-09 MED ORDER — PHENYLEPHRINE 40 MCG/ML (10ML) SYRINGE FOR IV PUSH (FOR BLOOD PRESSURE SUPPORT): 80.0000 ug | PREFILLED_SYRINGE | INTRAVENOUS | Status: DC | PRN

## 2021-08-09 MED ORDER — KETOROLAC TROMETHAMINE 30 MG/ML IJ SOLN
30.0000 mg | Freq: Four times a day (QID) | INTRAMUSCULAR | Status: AC
  Administered 2021-08-09 – 2021-08-10 (×3): 30 mg via INTRAVENOUS
  Filled 2021-08-09 (×3): qty 1

## 2021-08-09 MED ORDER — FENTANYL-BUPIVACAINE-NACL 0.5-0.125-0.9 MG/250ML-% EP SOLN
12.0000 mL/h | EPIDURAL | Status: DC | PRN
  Administered 2021-08-09: 11 mL/h via EPIDURAL

## 2021-08-09 MED ORDER — WITCH HAZEL-GLYCERIN EX PADS: 1.0000 "application " | MEDICATED_PAD | CUTANEOUS | Status: DC | PRN

## 2021-08-09 MED ORDER — LIDOCAINE-EPINEPHRINE (PF) 2 %-1:200000 IJ SOLN
INTRAMUSCULAR | Status: DC | PRN
  Administered 2021-08-09: 10 mL via EPIDURAL

## 2021-08-09 MED ORDER — EPHEDRINE 5 MG/ML INJ: 10.0000 mg | INTRAVENOUS | Status: DC | PRN

## 2021-08-09 MED ORDER — DIBUCAINE (PERIANAL) 1 % EX OINT: 1.0000 "application " | TOPICAL_OINTMENT | CUTANEOUS | Status: DC | PRN

## 2021-08-09 MED ORDER — CEFAZOLIN SODIUM-DEXTROSE 2-4 GM/100ML-% IV SOLN
2.0000 g | INTRAVENOUS | Status: AC
  Administered 2021-08-09: 2 g via INTRAVENOUS

## 2021-08-09 MED ORDER — ACETAMINOPHEN 10 MG/ML IV SOLN
1000.0000 mg | Freq: Once | INTRAVENOUS | Status: AC
  Administered 2021-08-09: 1000 mg via INTRAVENOUS

## 2021-08-09 MED ORDER — ONDANSETRON HCL 4 MG/2ML IJ SOLN
INTRAMUSCULAR | Status: AC
  Filled 2021-08-09: qty 2

## 2021-08-09 MED ORDER — TETANUS-DIPHTH-ACELL PERTUSSIS 5-2.5-18.5 LF-MCG/0.5 IM SUSY: 0.5000 mL | PREFILLED_SYRINGE | Freq: Once | INTRAMUSCULAR | Status: DC

## 2021-08-09 MED ORDER — ENOXAPARIN SODIUM 40 MG/0.4ML IJ SOSY
40.0000 mg | PREFILLED_SYRINGE | INTRAMUSCULAR | Status: DC
  Administered 2021-08-10 – 2021-08-11 (×2): 40 mg via SUBCUTANEOUS
  Filled 2021-08-09 (×3): qty 0.4

## 2021-08-09 MED ORDER — MEDROXYPROGESTERONE ACETATE 150 MG/ML IM SUSP: 150.0000 mg | INTRAMUSCULAR | Status: DC | PRN

## 2021-08-09 MED ORDER — FENTANYL CITRATE (PF) 100 MCG/2ML IJ SOLN
INTRAMUSCULAR | Status: AC
  Filled 2021-08-09: qty 2

## 2021-08-09 MED ORDER — MORPHINE SULFATE (PF) 0.5 MG/ML IJ SOLN
INTRAMUSCULAR | Status: AC
  Filled 2021-08-09: qty 10

## 2021-08-09 MED ORDER — NALOXONE HCL 0.4 MG/ML IJ SOLN: 0.4000 mg | INTRAMUSCULAR | Status: DC | PRN

## 2021-08-09 MED ORDER — ONDANSETRON HCL 4 MG/2ML IJ SOLN: 4.0000 mg | Freq: Three times a day (TID) | INTRAMUSCULAR | Status: DC | PRN

## 2021-08-09 MED ORDER — DIPHENHYDRAMINE HCL 50 MG/ML IJ SOLN
12.5000 mg | INTRAMUSCULAR | Status: DC | PRN
  Administered 2021-08-10: 12.5 mg via INTRAVENOUS
  Filled 2021-08-09: qty 1

## 2021-08-09 MED ORDER — OXYTOCIN-SODIUM CHLORIDE 30-0.9 UT/500ML-% IV SOLN
INTRAVENOUS | Status: AC
  Filled 2021-08-09: qty 500

## 2021-08-09 MED ORDER — FENTANYL CITRATE (PF) 100 MCG/2ML IJ SOLN
INTRAMUSCULAR | Status: DC | PRN
  Administered 2021-08-09: 100 ug via EPIDURAL

## 2021-08-09 MED ORDER — SODIUM CHLORIDE 0.9% FLUSH: 3.0000 mL | INTRAVENOUS | Status: DC | PRN

## 2021-08-09 MED ORDER — DIPHENHYDRAMINE HCL 25 MG PO CAPS: 25.0000 mg | ORAL_CAPSULE | Freq: Four times a day (QID) | ORAL | Status: DC | PRN

## 2021-08-09 MED ORDER — DIPHENHYDRAMINE HCL 25 MG PO CAPS: 25.0000 mg | ORAL_CAPSULE | ORAL | Status: DC | PRN

## 2021-08-09 MED ORDER — OXYCODONE HCL 5 MG PO TABS: 5.0000 mg | ORAL_TABLET | Freq: Once | ORAL | Status: DC | PRN

## 2021-08-09 MED ORDER — GABAPENTIN 300 MG PO CAPS
300.0000 mg | ORAL_CAPSULE | Freq: Every day | ORAL | Status: DC
  Administered 2021-08-09 – 2021-08-11 (×3): 300 mg via ORAL
  Filled 2021-08-09 (×3): qty 1

## 2021-08-09 MED ORDER — ACETAMINOPHEN 500 MG PO TABS
1000.0000 mg | ORAL_TABLET | Freq: Four times a day (QID) | ORAL | Status: DC
  Administered 2021-08-09 – 2021-08-12 (×10): 1000 mg via ORAL
  Filled 2021-08-09 (×14): qty 2

## 2021-08-09 MED ORDER — SOD CITRATE-CITRIC ACID 500-334 MG/5ML PO SOLN: 30.0000 mL | ORAL | Status: DC

## 2021-08-09 MED ORDER — SIMETHICONE 80 MG PO CHEW
80.0000 mg | CHEWABLE_TABLET | Freq: Three times a day (TID) | ORAL | Status: DC
  Administered 2021-08-10 – 2021-08-12 (×8): 80 mg via ORAL
  Filled 2021-08-09 (×9): qty 1

## 2021-08-09 MED ORDER — KETOROLAC TROMETHAMINE 30 MG/ML IJ SOLN
30.0000 mg | Freq: Once | INTRAMUSCULAR | Status: AC | PRN
  Administered 2021-08-09: 30 mg via INTRAVENOUS

## 2021-08-09 MED ORDER — MEPERIDINE HCL 25 MG/ML IJ SOLN
6.2500 mg | INTRAMUSCULAR | Status: DC | PRN
Start: 2021-08-09 — End: 2021-08-09

## 2021-08-09 MED ORDER — DIPHENHYDRAMINE HCL 50 MG/ML IJ SOLN: 25.0000 mg | INTRAMUSCULAR | Status: DC | PRN

## 2021-08-09 MED ORDER — MAGNESIUM HYDROXIDE 400 MG/5ML PO SUSP: 30.0000 mL | ORAL | Status: DC | PRN

## 2021-08-09 MED ORDER — PHENYLEPHRINE HCL-NACL 20-0.9 MG/250ML-% IV SOLN
INTRAVENOUS | Status: AC
  Filled 2021-08-09: qty 250

## 2021-08-09 MED ORDER — SODIUM CHLORIDE 0.9 % IV SOLN
INTRAVENOUS | Status: AC
  Filled 2021-08-09: qty 5

## 2021-08-09 MED ORDER — DIPHENHYDRAMINE HCL 50 MG/ML IJ SOLN: 25.0000 mg | Freq: Once | INTRAMUSCULAR | Status: DC

## 2021-08-09 MED ORDER — MEASLES, MUMPS & RUBELLA VAC IJ SOLR: 0.5000 mL | Freq: Once | INTRAMUSCULAR | Status: DC

## 2021-08-09 MED ORDER — NALOXONE HCL 4 MG/10ML IJ SOLN
1.0000 ug/kg/h | INTRAVENOUS | Status: DC | PRN
  Filled 2021-08-09: qty 5

## 2021-08-09 MED ORDER — OXYCODONE HCL 5 MG/5ML PO SOLN: 5.0000 mg | Freq: Once | ORAL | Status: DC | PRN

## 2021-08-09 MED ORDER — ARTIFICIAL TEARS OPHTHALMIC OINT
TOPICAL_OINTMENT | OPHTHALMIC | Status: AC
  Filled 2021-08-09: qty 3.5

## 2021-08-09 MED ORDER — PRENATAL MULTIVITAMIN CH
1.0000 | ORAL_TABLET | Freq: Every day | ORAL | Status: DC
  Administered 2021-08-10 – 2021-08-12 (×3): 1 via ORAL
  Filled 2021-08-09 (×3): qty 1

## 2021-08-09 MED ORDER — OXYTOCIN-SODIUM CHLORIDE 30-0.9 UT/500ML-% IV SOLN: 2.5000 [IU]/h | INTRAVENOUS | Status: AC

## 2021-08-09 MED ORDER — HYDROMORPHONE HCL 1 MG/ML IJ SOLN
0.2500 mg | INTRAMUSCULAR | Status: DC | PRN
  Administered 2021-08-09 (×2): 0.5 mg via INTRAVENOUS

## 2021-08-09 MED ORDER — ACETAMINOPHEN 10 MG/ML IV SOLN
INTRAVENOUS | Status: AC
  Filled 2021-08-09: qty 100

## 2021-08-09 MED ORDER — HYDROMORPHONE HCL 1 MG/ML IJ SOLN
INTRAMUSCULAR | Status: AC
  Filled 2021-08-09: qty 1

## 2021-08-09 MED ORDER — MENTHOL 3 MG MT LOZG: 1.0000 | LOZENGE | OROMUCOSAL | Status: DC | PRN

## 2021-08-09 MED ORDER — COCONUT OIL OIL
1.0000 "application " | TOPICAL_OIL | Status: DC | PRN
  Administered 2021-08-11: 1 via TOPICAL

## 2021-08-09 MED ORDER — PROMETHAZINE HCL 25 MG/ML IJ SOLN: 6.2500 mg | INTRAMUSCULAR | Status: DC | PRN

## 2021-08-09 MED ORDER — OXYCODONE HCL 5 MG PO TABS
5.0000 mg | ORAL_TABLET | ORAL | Status: DC | PRN
  Administered 2021-08-10 (×2): 5 mg via ORAL
  Administered 2021-08-10: 10 mg via ORAL
  Administered 2021-08-10: 5 mg via ORAL
  Administered 2021-08-10 – 2021-08-11 (×3): 10 mg via ORAL
  Administered 2021-08-11: 5 mg via ORAL
  Administered 2021-08-11 – 2021-08-12 (×5): 10 mg via ORAL
  Filled 2021-08-09 (×5): qty 2
  Filled 2021-08-09: qty 1
  Filled 2021-08-09 (×2): qty 2
  Filled 2021-08-09: qty 1
  Filled 2021-08-09 (×2): qty 2
  Filled 2021-08-09 (×3): qty 1

## 2021-08-09 MED ORDER — KETOROLAC TROMETHAMINE 30 MG/ML IJ SOLN: 30.0000 mg | Freq: Four times a day (QID) | INTRAMUSCULAR | Status: AC | PRN

## 2021-08-09 MED ORDER — DIPHENHYDRAMINE HCL 50 MG/ML IJ SOLN: 12.5000 mg | INTRAMUSCULAR | Status: DC | PRN

## 2021-08-09 MED ORDER — LACTATED RINGERS IV SOLN: INTRAVENOUS | Status: DC

## 2021-08-09 MED ORDER — DEXMEDETOMIDINE (PRECEDEX) IN NS 20 MCG/5ML (4 MCG/ML) IV SYRINGE
PREFILLED_SYRINGE | INTRAVENOUS | Status: DC | PRN
  Administered 2021-08-09 (×2): 8 ug via INTRAVENOUS
  Administered 2021-08-09 (×2): 12 ug via INTRAVENOUS

## 2021-08-09 MED ORDER — PHENYLEPHRINE HCL-NACL 20-0.9 MG/250ML-% IV SOLN
INTRAVENOUS | Status: DC | PRN
  Administered 2021-08-09: 15 ug/min via INTRAVENOUS

## 2021-08-09 MED ORDER — LIDOCAINE HCL (PF) 1 % IJ SOLN
INTRAMUSCULAR | Status: DC | PRN
  Administered 2021-08-09: 4 mL via EPIDURAL
  Administered 2021-08-09: 5 mL via EPIDURAL

## 2021-08-09 MED ORDER — ACETAMINOPHEN 500 MG PO TABS: 1000.0000 mg | ORAL_TABLET | Freq: Four times a day (QID) | ORAL | Status: DC

## 2021-08-09 MED ORDER — MEPERIDINE HCL 25 MG/ML IJ SOLN: 6.2500 mg | INTRAMUSCULAR | Status: DC | PRN

## 2021-08-09 MED ORDER — MIDAZOLAM HCL 2 MG/2ML IJ SOLN
INTRAMUSCULAR | Status: AC
  Filled 2021-08-09: qty 2

## 2021-08-09 MED ORDER — HYDROXYZINE HCL 10 MG PO TABS
10.0000 mg | ORAL_TABLET | Freq: Once | ORAL | Status: AC
  Administered 2021-08-09: 10 mg via ORAL
  Filled 2021-08-09: qty 1

## 2021-08-09 MED ORDER — SENNOSIDES-DOCUSATE SODIUM 8.6-50 MG PO TABS
2.0000 | ORAL_TABLET | Freq: Every day | ORAL | Status: DC
  Administered 2021-08-10 – 2021-08-12 (×3): 2 via ORAL
  Filled 2021-08-09 (×3): qty 2

## 2021-08-09 MED ORDER — SCOPOLAMINE 1 MG/3DAYS TD PT72
MEDICATED_PATCH | TRANSDERMAL | Status: AC
  Filled 2021-08-09: qty 1

## 2021-08-09 MED ORDER — MIDAZOLAM HCL 2 MG/2ML IJ SOLN
INTRAMUSCULAR | Status: DC | PRN
  Administered 2021-08-09: 2 mg via INTRAVENOUS

## 2021-08-09 MED ORDER — ONDANSETRON HCL 4 MG/2ML IJ SOLN
INTRAMUSCULAR | Status: DC | PRN
  Administered 2021-08-09: 4 mg via INTRAVENOUS

## 2021-08-09 MED ORDER — KETOROLAC TROMETHAMINE 30 MG/ML IJ SOLN
INTRAMUSCULAR | Status: AC
  Filled 2021-08-09: qty 1

## 2021-08-09 MED ORDER — IBUPROFEN 600 MG PO TABS
600.0000 mg | ORAL_TABLET | Freq: Four times a day (QID) | ORAL | Status: DC
  Administered 2021-08-10 – 2021-08-12 (×9): 600 mg via ORAL
  Filled 2021-08-09 (×9): qty 1

## 2021-08-09 MED ORDER — SCOPOLAMINE 1 MG/3DAYS TD PT72
1.0000 | MEDICATED_PATCH | Freq: Once | TRANSDERMAL | Status: AC
  Administered 2021-08-09: 1.5 mg via TRANSDERMAL

## 2021-08-09 SURGICAL SUPPLY — 39 items
BENZOIN TINCTURE PRP APPL 2/3 (GAUZE/BANDAGES/DRESSINGS) ×2 IMPLANT
CHLORAPREP W/TINT 26ML (MISCELLANEOUS) ×2 IMPLANT
CLAMP CORD UMBIL (MISCELLANEOUS) IMPLANT
CLOSURE STERI STRIP 1/2 X4 (GAUZE/BANDAGES/DRESSINGS) ×2 IMPLANT
CLOTH BEACON ORANGE TIMEOUT ST (SAFETY) ×2 IMPLANT
DRAPE C SECTION CLR SCREEN (DRAPES) IMPLANT
DRSG OPSITE POSTOP 4X10 (GAUZE/BANDAGES/DRESSINGS) ×2 IMPLANT
ELECT REM PT RETURN 9FT ADLT (ELECTROSURGICAL) ×2
ELECTRODE REM PT RTRN 9FT ADLT (ELECTROSURGICAL) ×1 IMPLANT
EXTRACTOR VACUUM M CUP 4 TUBE (SUCTIONS) IMPLANT
GLOVE BIO SURGEON STRL SZ7.5 (GLOVE) ×2 IMPLANT
GLOVE BIOGEL PI IND STRL 7.0 (GLOVE) ×1 IMPLANT
GLOVE BIOGEL PI INDICATOR 7.0 (GLOVE) ×1
GOWN STRL REUS W/TWL 2XL LVL3 (GOWN DISPOSABLE) ×2 IMPLANT
GOWN STRL REUS W/TWL LRG LVL3 (GOWN DISPOSABLE) ×4 IMPLANT
KIT ABG SYR 3ML LUER SLIP (SYRINGE) IMPLANT
NEEDLE HYPO 22GX1.5 SAFETY (NEEDLE) ×2 IMPLANT
NEEDLE HYPO 25X5/8 SAFETYGLIDE (NEEDLE) IMPLANT
NS IRRIG 1000ML POUR BTL (IV SOLUTION) ×2 IMPLANT
PACK C SECTION WH (CUSTOM PROCEDURE TRAY) ×2 IMPLANT
PAD OB MATERNITY 4.3X12.25 (PERSONAL CARE ITEMS) ×2 IMPLANT
PENCIL SMOKE EVAC W/HOLSTER (ELECTROSURGICAL) ×2 IMPLANT
RTRCTR C-SECT PINK 25CM LRG (MISCELLANEOUS) ×2 IMPLANT
STRIP CLOSURE SKIN 1/2X4 (GAUZE/BANDAGES/DRESSINGS) ×2 IMPLANT
SUT CHROMIC 1 CTX 36 (SUTURE) ×8 IMPLANT
SUT PLAIN 0 NONE (SUTURE) IMPLANT
SUT VIC AB 1 CT1 36 (SUTURE) ×2 IMPLANT
SUT VIC AB 2-0 CT1 (SUTURE) ×2 IMPLANT
SUT VIC AB 2-0 CT1 27 (SUTURE) ×1
SUT VIC AB 2-0 CT1 TAPERPNT 27 (SUTURE) ×1 IMPLANT
SUT VIC AB 3-0 CT1 27 (SUTURE) ×1
SUT VIC AB 3-0 CT1 TAPERPNT 27 (SUTURE) ×1 IMPLANT
SUT VIC AB 3-0 SH 27 (SUTURE)
SUT VIC AB 3-0 SH 27X BRD (SUTURE) IMPLANT
SUT VIC AB 4-0 KS 27 (SUTURE) ×2 IMPLANT
SYR BULB IRRIGATION 50ML (SYRINGE) IMPLANT
TOWEL OR 17X24 6PK STRL BLUE (TOWEL DISPOSABLE) ×2 IMPLANT
TRAY FOLEY W/BAG SLVR 14FR LF (SET/KITS/TRAYS/PACK) ×2 IMPLANT
WATER STERILE IRR 1000ML POUR (IV SOLUTION) ×2 IMPLANT

## 2021-08-09 NOTE — Progress Notes (Signed)
Labor Progress Note Kristin Foster is a 25 y.o. G3P1011 at [redacted]w[redacted]d presented for eIOL/TOLAC.  S: Patient feeling comfortable. Just received dose of fentanyl. Agreeable to FB.   O:  BP 113/70   Pulse 81   Temp 98.4 F (36.9 C) (Oral)   Resp 18   Ht 5' (1.524 m)   Wt 76.3 kg   LMP 10/30/2020   BMI 32.85 kg/m  EFM: 145/moderate/+accelerations, one variable deceleration  CVE: Dilation: 2.5 Effacement (%): Thick Cervical Position: Posterior Station: -3, Ballotable Presentation: Vertex Exam by:: Ricardo Jericho, RN  A&P: 25 y.o. G3P1011 [redacted]w[redacted]d eIOL/TOLAC #Labor: Progressing well. FB placed. Continue low dose pit with a ceiling of 10 while balloon is in.  #Pain: IV pain med, epidural on request #FWB: currently cat 1, patient with one variable and one prolonged decel that improved with position change #GBS negative   Trula Slade, MD PGY-2 12:20 AM

## 2021-08-09 NOTE — Transfer of Care (Signed)
Immediate Anesthesia Transfer of Care Note  Patient: Kristin Foster  Procedure(s) Performed: CESAREAN SECTION  Patient Location: PACU  Anesthesia Type:Epidural  Level of Consciousness: awake, alert , oriented and patient cooperative  Airway & Oxygen Therapy: Patient Spontanous Breathing  Post-op Assessment: Report given to RN and Post -op Vital signs reviewed and stable  Post vital signs: Reviewed and stable  Last Vitals:  Vitals Value Taken Time  BP 128/97 08/09/21 1435  Temp    Pulse    Resp 21 08/09/21 1438  SpO2    Vitals shown include unvalidated device data.  Last Pain:  Vitals:   08/09/21 1036  TempSrc:   PainSc: 5       Patients Stated Pain Goal: 0 (08/09/21 1036)  Complications: No notable events documented.

## 2021-08-09 NOTE — Anesthesia Preprocedure Evaluation (Signed)
Anesthesia Evaluation  Patient identified by MRN, date of birth, ID band Patient awake    Reviewed: Allergy & Precautions, Patient's Chart, lab work & pertinent test results  Airway Mallampati: II  TM Distance: >3 FB Neck ROM: Full    Dental no notable dental hx. (+) Teeth Intact   Pulmonary Current Smoker and Patient abstained from smoking.,    Pulmonary exam normal breath sounds clear to auscultation       Cardiovascular negative cardio ROS Normal cardiovascular exam Rhythm:Regular Rate:Normal     Neuro/Psych  Headaches, negative psych ROS   GI/Hepatic Neg liver ROS, GERD  ,  Endo/Other  Obesity  Renal/GU negative Renal ROS  negative genitourinary   Musculoskeletal negative musculoskeletal ROS (+)   Abdominal (+) + obese,   Peds  Hematology negative hematology ROS (+)   Anesthesia Other Findings   Reproductive/Obstetrics (+) Pregnancy Hx/o previous C/Section x 1 for arrest of descent                             Anesthesia Physical Anesthesia Plan  ASA: 2  Anesthesia Plan: Epidural   Post-op Pain Management:    Induction:   PONV Risk Score and Plan:   Airway Management Planned: Natural Airway  Additional Equipment:   Intra-op Plan:   Post-operative Plan:   Informed Consent: I have reviewed the patients History and Physical, chart, labs and discussed the procedure including the risks, benefits and alternatives for the proposed anesthesia with the patient or authorized representative who has indicated his/her understanding and acceptance.       Plan Discussed with: Anesthesiologist  Anesthesia Plan Comments:         Anesthesia Quick Evaluation

## 2021-08-09 NOTE — Anesthesia Postprocedure Evaluation (Signed)
Anesthesia Post Note  Patient: Elisabet Mctigue  Procedure(s) Performed: CESAREAN SECTION     Patient location during evaluation: PACU Anesthesia Type: Epidural Level of consciousness: awake and alert and oriented Pain management: pain level controlled Vital Signs Assessment: post-procedure vital signs reviewed and stable Respiratory status: spontaneous breathing, nonlabored ventilation and respiratory function stable Cardiovascular status: blood pressure returned to baseline and stable Postop Assessment: no headache, no backache, spinal receding and no apparent nausea or vomiting Anesthetic complications: no   No notable events documented.  Last Vitals:  Vitals:   08/09/21 1530 08/09/21 1546  BP: (!) 97/57 96/62  Pulse: 73 72  Resp: 18 17  Temp:    SpO2: 100% 99%    Last Pain:  Vitals:   08/09/21 1515  TempSrc:   PainSc: 5    Pain Goal: Patients Stated Pain Goal: 0 (08/09/21 1036)  LLE Motor Response: Purposeful movement (08/09/21 1530) LLE Sensation: Tingling (08/09/21 1530) RLE Motor Response: Purposeful movement (08/09/21 1530) RLE Sensation: Tingling (08/09/21 1530)     Epidural/Spinal Function Cutaneous sensation: Pins and Needles (08/09/21 1530), Patient able to flex knees: Yes (08/09/21 1530), Patient able to lift hips off bed: Yes (08/09/21 1530), Back pain beyond tenderness at insertion site: No (08/09/21 1530), Progressively worsening motor and/or sensory loss: No (08/09/21 1530), Bowel and/or bladder incontinence post epidural: No (08/09/21 1530)  Lannie Fields

## 2021-08-09 NOTE — Progress Notes (Signed)
Labor Progress Note:   Checked patient earlier and now about 8cm with a swollen cervix on anterior/right lateral portion. Previously called about 9-9.5. FHT currently Cat II but reassuring with baseline 150/mod var/15x15/decels (unclear without able to monitor ctx), improving with sitting upright and decreasing pit from 10>5. Recommended placing into her left lateral side, IV benadryl, placing an IUPC, and monitoring.   She discussed with her family as she is worried about a similar experience from her last birth that ended with C-section after several hours of pushing. After discussion, they opted to proceed with repeat C/S rather than proceeding with labor of unknown further duration.     The risks of cesarean section discussed with the patient included but were not limited to: bleeding which may require transfusion or reoperation; infection which may require antibiotics; injury to bowel, bladder, ureters or other surrounding organs; injury to the fetus; need for additional procedures including hysterectomy in the event of a life-threatening hemorrhage; placental abnormalities wth subsequent pregnancies, incisional problems, thromboembolic phenomenon and other postoperative/anesthesia complications. The patient concurred with the proposed plan, giving informed written consent for the procedure. Anesthesia and OR aware. Preoperative prophylactic antibiotics and SCDs ordered on call to the OR.   To OR when ready. Dr. Alysia Penna present for conversation.   Allayne Stack, DO

## 2021-08-09 NOTE — Anesthesia Procedure Notes (Signed)
Epidural Patient location during procedure: OB Start time: 08/09/2021 2:40 AM End time: 08/09/2021 2:48 AM  Staffing Anesthesiologist: Mal Amabile, MD Performed: anesthesiologist   Preanesthetic Checklist Completed: patient identified, IV checked, site marked, risks and benefits discussed, surgical consent, monitors and equipment checked, pre-op evaluation and timeout performed  Epidural Patient position: sitting Prep: DuraPrep and site prepped and draped Patient monitoring: continuous pulse ox and blood pressure Approach: midline Location: L3-L4 Injection technique: LOR air  Needle:  Needle type: Tuohy  Needle gauge: 17 G Needle length: 9 cm and 9 Needle insertion depth: 5 cm Catheter type: closed end flexible Catheter size: 19 Gauge Catheter at skin depth: 10 cm Test dose: negative and Other  Assessment Events: blood not aspirated, injection not painful, no injection resistance, no paresthesia and negative IV test  Additional Notes Patient identified. Risks and benefits discussed including failed block, incomplete  Pain control, post dural puncture headache, nerve damage, paralysis, blood pressure Changes, nausea, vomiting, reactions to medications-both toxic and allergic and post Partum back pain. All questions were answered. Patient expressed understanding and wished to proceed. Sterile technique was used throughout procedure. Epidural site was Dressed with sterile barrier dressing. No paresthesias, signs of intravascular injection Or signs of intrathecal spread were encountered.  Patient was more comfortable after the epidural was dosed. Please see RN's note for documentation of vital signs and FHR which are stable. Reason for block:procedure for pain

## 2021-08-09 NOTE — Discharge Summary (Signed)
Postpartum Discharge Summary     Patient Name: Kristin Foster DOB: 20-Sep-1995 MRN: [id number]  Date of admission: 08/08/2021 Delivery date:08/09/2021  Delivering provider: Chancy Milroy  Date of discharge: 08/12/2021  Admitting diagnosis: Indication for care in labor or delivery [O75.9] Intrauterine pregnancy: [redacted]w[redacted]d    Secondary diagnosis:  Principal Problem:   Cesarean delivery delivered Active Problems:   Supervision of other normal pregnancy, antepartum   Previous cesarean delivery affecting pregnancy, antepartum  Additional problems: Acute blood loss anemia (PO iron)    Discharge diagnosis: Term Pregnancy Delivered                                              Post partum procedures: None  Augmentation: Pitocin  Complications: None  Hospital course: 25y.o. yo [id number] admitted to the hospital 08/08/2021 for scheduled IOL/TOLAC. Patient initially progressed with Pitocin and SROM, however, she ultimately decided to proceed with repeat cesarean section electively. Delivery details are as follows:  Membrane Rupture Time/Date: 3:05 AM ,08/09/2021   Delivery Method:C-Section, Low Transverse  Details of operation can be found in separate operative note.  Patient had an uncomplicated postpartum course.  She is ambulating, tolerating a regular diet, passing flatus, and urinating well. Patient is discharged home in stable condition on  08/12/21        Newborn Data: Birth date:08/09/2021  Birth time:1:26 PM  Gender:Female  Living status:Living  Apgars:5 ,7  Weight:3240 g     Magnesium Sulfate received: No BMZ received: No Rhophylac: N/A MMR: N/A T-DaP: Given prenatally Flu: Yes Transfusion: No  Physical exam  Vitals:   08/11/21 0534 08/11/21 1408 08/11/21 2118 08/12/21 0550  BP: (!) 94/59 94/60 (!) 94/59 104/63  Pulse: 75 93 80 85  Resp: 16 16 16 16   Temp: 97.9 F (36.6 C) 98.2 F (36.8 C) 98.6 F (37 C) 98.3 F (36.8 C)  TempSrc: Oral     SpO2: 100%  99% 99% 100%  Weight:      Height:       General: alert, cooperative, and no distress Lochia: appropriate Uterine Fundus: firm and below umbilicus Incision: healing well with no significant drainage, no significant erythema, mild saturation of honeycomb DVT Evaluation: trace LE edema bilaterally  Labs: Lab Results  Component Value Date   WBC 15.1 (H) 08/10/2021   HGB 9.8 (L) 08/10/2021   HCT 28.3 (L) 08/10/2021   MCV 91.6 08/10/2021   PLT 162 08/10/2021   No flowsheet data found. Edinburgh Score: Edinburgh Postnatal Depression Scale Screening Tool 08/11/2021  I have been able to laugh and see the funny side of things. 0  I have looked forward with enjoyment to things. 0  I have blamed myself unnecessarily when things went wrong. 1  I have been anxious or worried for no good reason. 0  I have felt scared or panicky for no good reason. 0  Things have been getting on top of me. 0  I have been so unhappy that I have had difficulty sleeping. 2  I have felt sad or miserable. 0  I have been so unhappy that I have been crying. 0  The thought of harming myself has occurred to me. 0  Edinburgh Postnatal Depression Scale Total 3     After visit meds:  Allergies as of 08/12/2021   No Known Allergies  Medication List     TAKE these medications    acetaminophen 500 MG tablet Commonly known as: TYLENOL Take 2 tablets (1,000 mg total) by mouth every 8 (eight) hours as needed (pain). What changed:  when to take this reasons to take this   ferrous sulfate 325 (65 FE) MG tablet Take 1 tablet (325 mg total) by mouth every other day. Start taking on: August 13, 2021   ibuprofen 600 MG tablet Commonly known as: ADVIL Take 1 tablet (600 mg total) by mouth every 6 (six) hours.   norethindrone 0.35 MG tablet Commonly known as: MICRONOR Take 1 tablet (0.35 mg total) by mouth daily. (For birth control)   oxyCODONE 5 MG immediate release tablet Commonly known as: Oxy  IR/ROXICODONE Take 1-2 tablets (5-10 mg total) by mouth every 4 (four) hours as needed for severe pain or breakthrough pain.   PRENATAL VITAMINS PO Take 1 tablet by mouth daily.               Discharge Care Instructions  (From admission, onward)           Start     Ordered   08/12/21 0000  Discharge wound care:       Comments: Remove dressing 5 days after your surgery. You can then wash area gently with soap and water in the shower and pat dry. Do not soak.   08/12/21 0755             Discharge home in stable condition Infant Feeding: Bottle and Breast Infant Disposition: home with mother Discharge instruction: per After Visit Summary and Postpartum booklet. Activity: Advance as tolerated. Pelvic rest for 6 weeks.  Diet: routine diet Future Appointments: Future Appointments  Date Time Provider [place]  08/18/2021  1:30 PM [facility] NURSE [facility]  09/10/2021  8:15 AM Clarnce Flock, MD [facility]   Follow up Visit: Please schedule this patient for a Virtual postpartum visit in 4 weeks with the following provider: Any provider. Additional Postpartum F/U: Incision check in 1 week Low risk pregnancy complicated by: Hx of CS Delivery mode:  C-Section, Low Transverse  Anticipated Birth Control:  POPs  08/12/2021 Genia Del, MD

## 2021-08-09 NOTE — Op Note (Signed)
Cesarean Section Procedure Note  08/09/2021  2:27 PM  PATIENT:  Kristin Foster  25 y.o. female  PRE-OPERATIVE DIAGNOSIS:  repeat cesarean section failure to descend, arrest of dilation  POST-OPERATIVE DIAGNOSIS:  repeat cesarean section failure to descend, arrest of dilation  PROCEDURE:  Procedure(s): CESAREAN SECTION (N/A)  SURGEON:  Surgeon(s) and Role:    * Hermina Staggers, MD - Primary    * Baldomero Mirarchi N, DO - Assisting  ANESTHESIA: epidural  EBL: 1500 ml   BLOOD ADMINISTERED: none  LOCAL MEDICATIONS USED:  NONE  PLACENTA: Sent to pathology  Procedure Details  INDICATIONS: Kristin Foster is a 25 y.o. P7T0626 at [redacted]w[redacted]d scheduled for cesarean section secondary to failure to progress: arrest of descent/arrest of dilation, and previous uterine incision.  The risks of cesarean section discussed with the patient included but were not limited to: bleeding which may require transfusion or reoperation; infection which may require antibiotics; injury to bowel, bladder, ureters or other surrounding organs; injury to the fetus; need for additional procedures including hysterectomy in the event of a life-threatening hemorrhage; placental abnormalities wth subsequent pregnancies, incisional problems, thromboembolic phenomenon and other postoperative/anesthesia complications. The patient concurred with the proposed plan, giving informed written consent for the procedure.    After anesthesia found to be adequate, the patient was draped and prepped in the usual sterile manner. A Pfannenstiel incision was made and carried down through the subcutaneous tissue to the fascia. Fascial incision was made and extended transversely. The fascia was separated from the underlying rectus tissue superiorly and inferiorly. The peritoneum was identified and entered. Peritoneal incision was extended longitudinally. There were mild vesicouterine adhesions noted. The utero-vesical peritoneal reflection was incised  transversely and the bladder flap was bluntly freed from the lower uterine segment. A low transverse uterine incision was made. Thick meconium fluid was encountered. Had difficulty delivering fetal head with initial presentation OT into the pelvis, moved and delivered in breech presentation. Delivered from breech presentation was a 3240 gram Female with Apgar scores of 5 at one minute, 7 at five minutes, and 9 at 10 minutes. After the umbilical cord was clamped and cut cord blood was obtained for evaluation. Cord ABG pH 7.26. The placenta was removed intact and appeared normal. The uterine outline, tubes and ovaries appeared normal. The uterine incision was closed with running locked sutures of Vicryl. Several figure of eight serosal stiches were placed for hemostasis. Arista was placed. Hemostasis was then observed. The peritoneum was closed. The fascia was then reapproximated with running sutures of Vicryl. The skin was reapproximated with 4-0 Vicryl.  Instrument, sponge, and needle counts were correct prior the abdominal closure and at the conclusion of the case.   Complications:  PPH , mild vesicouterine adhesive disease and uterine to left lateral abdominal wall adhesion ; patient tolerated the procedure well.  COUNTS:  YES  PATIENT DISPOSITION:  PACU - hemodynamically stable.         Condition: stable  Allayne Stack, DO 08/09/2021 2:27 PM

## 2021-08-10 LAB — CBC
HCT: 28.3 % — ABNORMAL LOW (ref 36.0–46.0)
Hemoglobin: 9.8 g/dL — ABNORMAL LOW (ref 12.0–15.0)
MCH: 31.7 pg (ref 26.0–34.0)
MCHC: 34.6 g/dL (ref 30.0–36.0)
MCV: 91.6 fL (ref 80.0–100.0)
Platelets: 162 10*3/uL (ref 150–400)
RBC: 3.09 MIL/uL — ABNORMAL LOW (ref 3.87–5.11)
RDW: 12.5 % (ref 11.5–15.5)
WBC: 15.1 10*3/uL — ABNORMAL HIGH (ref 4.0–10.5)
nRBC: 0 % (ref 0.0–0.2)

## 2021-08-10 MED ORDER — FERROUS SULFATE 325 (65 FE) MG PO TABS
325.0000 mg | ORAL_TABLET | ORAL | Status: DC
  Administered 2021-08-11: 325 mg via ORAL
  Filled 2021-08-10: qty 1

## 2021-08-10 MED ORDER — OXYCODONE HCL 5 MG PO TABS
5.0000 mg | ORAL_TABLET | Freq: Once | ORAL | Status: AC
  Administered 2021-08-11: 5 mg via ORAL
  Filled 2021-08-10: qty 1

## 2021-08-10 NOTE — Progress Notes (Signed)
POSTPARTUM PROGRESS NOTE  POD #1  Subjective:  Kristin Foster is a 25 y.o. H7D4287 s/p rLTCS at [redacted]w[redacted]d. No acute events overnight. She reports she is doing well. She denies any problems with ambulating or po intake. Denies nausea or vomiting. She has passed flatus. Pain is well controlled.  Lochia is mild.  RN reported I&O cath with 900cc of clear urine around 1300 today as patient had not voided. States she has some labial swelling after laboring yesterday, placed ice in the area to help.   Objective: Blood pressure 106/66, pulse 90, temperature 98 F (36.7 C), temperature source Oral, resp. rate 18, height 5' (1.524 m), weight 76.3 kg, last menstrual period 10/30/2020, SpO2 100 %, unknown if currently breastfeeding.  Physical Exam:  General: alert, cooperative and no distress Chest: no respiratory distress Heart: regular rate, distal pulses intact Uterine Fundus: firm, appropriately tender DVT Evaluation: No calf swelling or tenderness Extremities: pedal edema bilaterally  Skin: warm, dry; pressure dressing in place, looked at parts of honeycomb underneath which appears dry and intact, some midline saturation seen.   Recent Labs    08/08/21 1700 08/10/21 0504  HGB 13.2 9.8*  HCT 38.7 28.3*    Assessment/Plan: Kristin Foster is a 25 y.o. G8T1572 s/p rLTCS at [redacted]w[redacted]d for arrest of dilation/descent.  POD#1 - Doing welll; pain well controlled.   Routine postpartum care  OOB, ambulated without complaint   Lovenox for VTE prophylaxis Acute blood loss Anemia: asymptomatic  Start po ferrous sulfate every other day (IV already out and patient did not want to do IV venofer)   #Urinary retention: in setting of post-op as above. I&O two hours ago with 900 cc clear urine. Will monitor for spontaneous void.   Contraception: POPs Feeding: breast/formula   Dispo: Plan for discharge tomorrow if doing well, otherwise on POD#3.   LOS: 2 days   Kristin Penna, DO OB Fellow  08/10/2021,  2:53 PM

## 2021-08-10 NOTE — Lactation Note (Signed)
This note was copied from a baby's chart. Lactation Consultation Note  Patient Name: Kristin Foster Today's Date: 08/10/2021   Age:26 hoursP2, baby having a circ. As LC entered the room mom pumping left breast only due to areola edema and tissue being tough.  LC checked the flange and the #24 F was snug. LC increased and per mom more comfortable.  LC noted the sides of nipples to have cracking.   Maternal Data    Feeding    LATCH Score- Latch was from the [facility]  Latch: Grasps breast easily, tongue down, lips flanged, rhythmical sucking.  Audible Swallowing: Spontaneous and intermittent  Type of Nipple: Everted at rest and after stimulation  Comfort (Breast/Nipple): Soft / non-tender  Hold (Positioning): No assistance needed to correctly position infant at breast.  LATCH Score: 10   Lactation Tools Discussed/Used    Interventions    Discharge    Consult Status      Kristin Foster 08/10/2021, 4:10 PM

## 2021-08-11 MED ORDER — OXYCODONE HCL 5 MG PO TABS
5.0000 mg | ORAL_TABLET | ORAL | Status: AC
  Administered 2021-08-11: 5 mg via ORAL
  Filled 2021-08-11: qty 1

## 2021-08-11 NOTE — Lactation Note (Signed)
This note was copied from a baby's chart. Lactation Consultation Note  Patient Name: Kristin Foster [id number] Date: 08/11/2021 Reason for consult: Follow-up assessment;Mother's request Age:26 hours Mom requested LC services due to sore and cracked nipples and wanted to use something else aside from coconut oil and EBM. LC reinforced continue using coconut oil and EBM, RN will give mom comfort gels to help with nipple abrasions. Mom was given 24 mm NS as protective barrier due to nipple soreness, crack nipples and abrasions.  Mom was shown how to apply NS and infant briefly latch and mom felt less pain, LC reinforced mom asking for help with latch assistance, work on latching infant with depth at the breast, extending low jaw, flanging top and bottom lip outward.  Mom had pumped 10 mls of breast milk, mom will latch infant at the next feeding and offer her EBM before formula.    Maternal Data    Feeding Nipple Type: Nfant Standard Flow (white)  LATCH Score                    Lactation Tools Discussed/Used Tools: Nipple Shields Nipple shield size: 24 (Mom has abraisons and right breast was bleeding with recentl latch. LC reinforced mom allow infant's nose to touch breast to help with infant having deeper latch. NS used as protective barrier.)  Interventions Interventions: Skin to skin;Position options;Support pillows;Adjust position;Breast compression  Discharge    Consult Status Consult Status: Follow-up Date: 08/12/21 Follow-up type: In-patient    Danelle Earthly 08/11/2021, 9:42 PM

## 2021-08-11 NOTE — Lactation Note (Signed)
This note was copied from a baby's chart. Lactation Consultation Note  Patient Name: Kristin Foster Date: 08/11/2021 Reason for consult: Term;Infant weight loss;Follow-up assessment (-4% weight loss) Age:27 hours LC changed stool brownish yellow and seedy while in room. LC entered the room, infant was suckling on pacifier. Per mom, infant was recently breastfed for 20 minutes and after 40 mls of Similac Total Comfort 360 with iron. Per mom, she has breast soreness LC mention coconut oil and applying EBM on breast and let air dry. Mom wanted LC review latch, LC asked mom pre-pump breast with hand pump which everted nipple shaft out more and mom latched infant on her left breast using the cross cradle hold, infant latched with depth. Swallows were heard, per mom, no pain or discomfort with this latch, mom understands if she feels pain and not a tug to re-latch infant at the breast. Mom is starting to leak, she is wearing breast pads, LC suggested mom pump her left breast using the DEBP, mom had easily expressed 7 mls of colostrum as LC left the room. Mom is allowing right breast to heal and she applied EBM and let air dry. Mom's plan: 1- She will continue to latch infant first at the breast by  hunger cues, afterwards will offer infant any EBM that she pumped first before offering formula. 2- LC discussed discharge education: treatment and prevention of engorgement, warning signs of dehydration in infant and how do you know breastfeeding is going well. 3- LC reviewed community resources after hospital discharge: LC hotline, [facility] outpatient clinic and online breastfeeding support group.  Maternal Data    Feeding Mother's Current Feeding Choice: Breast Milk and Formula  LATCH Score Latch: Grasps breast easily, tongue down, lips flanged, rhythmical sucking.  Audible Swallowing: Spontaneous and intermittent  Type of Nipple: Everted at rest and after stimulation  Comfort  (Breast/Nipple): Filling, red/small blisters or bruises, mild/mod discomfort  Hold (Positioning): Assistance needed to correctly position infant at breast and maintain latch.  LATCH Score: 8   Lactation Tools Discussed/Used Tools: Pump Flange Size: 27 (Mom pre-pumped prior to latching infant at the breast.)  Interventions Interventions: Breast compression;Pre-pump if needed;Adjust position;Support pillows;Position options;DEBP;Hand pump;Education;Pace feeding  Discharge Discharge Education: Engorgement and breast care;Warning signs for feeding baby  Consult Status Consult Status: Complete Date: 08/11/21 Follow-up type: Physician    Danelle Earthly 08/11/2021, 4:11 PM

## 2021-08-11 NOTE — Progress Notes (Signed)
Pt reports that her pain has not been controlled for most of the day despite getting ibuprofen, tylenol, and 10 mg oxycodone at regular intervals.  She asks if there is a stronger medicine she could have.  Phoned Dr. Maurice Small with above request.  See order for one additional 5 mg dose of oxycodone to be given with next 10 mg dose for a one time dose of 15 mg.  She encourages nursing staff to help patient understand realistic pain management goals after surgery.

## 2021-08-12 LAB — SURGICAL PATHOLOGY

## 2021-08-12 MED ORDER — IBUPROFEN 600 MG PO TABS
600.0000 mg | ORAL_TABLET | Freq: Four times a day (QID) | ORAL | 0 refills | Status: DC
Start: 2021-08-12 — End: 2021-09-05

## 2021-08-12 MED ORDER — FERROUS SULFATE 325 (65 FE) MG PO TABS: 325.0000 mg | ORAL_TABLET | ORAL | 1 refills | Status: AC

## 2021-08-12 MED ORDER — OXYCODONE HCL 5 MG PO TABS: 5.0000 mg | ORAL_TABLET | ORAL | 0 refills | Status: DC | PRN

## 2021-08-12 MED ORDER — NORETHINDRONE 0.35 MG PO TABS: 1.0000 | ORAL_TABLET | Freq: Every day | ORAL | 5 refills | Status: DC

## 2021-08-12 MED ORDER — ACETAMINOPHEN 500 MG PO TABS
1000.0000 mg | ORAL_TABLET | Freq: Three times a day (TID) | ORAL | 0 refills | Status: DC | PRN
Start: 2021-08-12 — End: 2021-08-26

## 2021-08-12 NOTE — Progress Notes (Signed)
Per request of lactation consultant - All Purpose Nipple Cream called into [facility]  Warner Mccreedy, MD, MPH OB Fellow, Faculty Practice

## 2021-08-12 NOTE — Lactation Note (Signed)
This note was copied from a baby's chart. Lactation Consultation Note  Patient Name: Kristin Foster [id number] Date: 08/12/2021 Reason for consult: Follow-up assessment;Nipple pain/trauma Age:26 hours [name] called regarding pumping milk with blood in it.  LC assessed EBM / and used a swab to spread the milk on white back ground / light pink.  LC reassured mom its ok to feed the baby the milk.  Lc assessed the right nipple that is sore / scabbed nipple / ( appears better than yesterday.  LC recommended having her Doctor call in "All purpose nipple cream" to promote healing.  Coconut oil with pumping and increasing flange from the #24 F to #27 F or #30 F until soreness is completely heals.  Breast feed on the left side / supplement after feedings - pace feeding.  LC reviewed BF D/C teaching - see below.  Mom receptive to return for [facility] O/P with [place].  Maternal Data    Feeding Mother's Current Feeding Choice: Breast Milk and Formula Nipple Type: Nfant Standard Flow (white)  LATCH Score                    Lactation Tools Discussed/Used    Interventions    Discharge    Consult Status Consult Status: Complete Date: 08/12/21    [name] 08/12/2021, 9:51 AM

## 2021-08-18 ENCOUNTER — Other Ambulatory Visit: Payer: Self-pay

## 2021-08-18 ENCOUNTER — Ambulatory Visit (INDEPENDENT_AMBULATORY_CARE_PROVIDER_SITE_OTHER): Payer: Medicaid Other | Admitting: *Deleted

## 2021-08-18 ENCOUNTER — Telehealth: Payer: Self-pay | Admitting: *Deleted

## 2021-08-18 VITALS — BP 120/73 | HR 64 | Temp 98.9°F | Ht 61.0 in | Wt 163.1 lb

## 2021-08-18 DIAGNOSIS — G8918 Other acute postprocedural pain: Secondary | ICD-10-CM

## 2021-08-18 DIAGNOSIS — R109 Unspecified abdominal pain: Secondary | ICD-10-CM

## 2021-08-18 LAB — CBC WITH DIFFERENTIAL/PLATELET
Basophils Absolute: 0.1 10*3/uL (ref 0.0–0.2)
Basos: 0 %
EOS (ABSOLUTE): 0.1 10*3/uL (ref 0.0–0.4)
Eos: 1 %
Hematocrit: 30 % — ABNORMAL LOW (ref 34.0–46.6)
Hemoglobin: 9.9 g/dL — ABNORMAL LOW (ref 11.1–15.9)
Immature Grans (Abs): 0.3 10*3/uL — ABNORMAL HIGH (ref 0.0–0.1)
Immature Granulocytes: 2 %
Lymphocytes Absolute: 1.7 10*3/uL (ref 0.7–3.1)
Lymphs: 13 %
MCH: 30.2 pg (ref 26.6–33.0)
MCHC: 33 g/dL (ref 31.5–35.7)
MCV: 92 fL (ref 79–97)
Monocytes Absolute: 0.8 10*3/uL (ref 0.1–0.9)
Monocytes: 6 %
Neutrophils Absolute: 10.1 10*3/uL — ABNORMAL HIGH (ref 1.4–7.0)
Neutrophils: 78 %
Platelets: 410 10*3/uL (ref 150–450)
RBC: 3.28 x10E6/uL — ABNORMAL LOW (ref 3.77–5.28)
RDW: 12.4 % (ref 11.7–15.4)
WBC: 12.9 10*3/uL — ABNORMAL HIGH (ref 3.4–10.8)

## 2021-08-18 MED ORDER — OXYCODONE-ACETAMINOPHEN 5-325 MG PO TABS: 1.0000 | ORAL_TABLET | Freq: Four times a day (QID) | ORAL | 0 refills | Status: DC | PRN

## 2021-08-18 MED ORDER — AMOXICILLIN-POT CLAVULANATE 875-125 MG PO TABS: 1.0000 | ORAL_TABLET | Freq: Two times a day (BID) | ORAL | 0 refills | Status: AC
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
## 2021-08-18 NOTE — Progress Notes (Signed)
Here for wound check. Had c/s 08/09/21. Wants to get refill of oxycodone. States pain is 10 and tylenol and ibuprofen do not relieve enough. States has been having fever last 2 days but has not actually checked her temperature. Honeycomb dressing removed. Steristrips removed with scant dried dark bloody discharge. Incision remains CDI without redness, edema, or discharge. Patient c/o tenderness/ pain when abdomen touched above incision. Dr. Vergie Living In to see patient.   Wound care instructions given. Urine culture ordered and fu in one week. He will order antibiotics and more pain medicine. Clean catch Urine obtained for UA and culture.  Gregg Holster,RN

## 2021-08-19 ENCOUNTER — Inpatient Hospital Stay (HOSPITAL_COMMUNITY)
Admission: AD | Admit: 2021-08-19 | Discharge: 2021-08-20 | Disposition: A | Discharge status: Home / Self Care | Payer: Medicaid Other | Attending: Obstetrics & Gynecology | Admitting: Obstetrics & Gynecology

## 2021-08-19 ENCOUNTER — Encounter (HOSPITAL_COMMUNITY): Payer: Self-pay | Admitting: Obstetrics & Gynecology

## 2021-08-19 ENCOUNTER — Telehealth: Payer: Self-pay | Admitting: Lactation Services

## 2021-08-19 DIAGNOSIS — Z98891 History of uterine scar from previous surgery: Secondary | ICD-10-CM

## 2021-08-19 DIAGNOSIS — G8918 Other acute postprocedural pain: Secondary | ICD-10-CM | POA: Diagnosis not present

## 2021-08-19 DIAGNOSIS — R109 Unspecified abdominal pain: Secondary | ICD-10-CM | POA: Diagnosis present

## 2021-08-19 LAB — COMPREHENSIVE METABOLIC PANEL
ALT: 21 U/L (ref 0–44)
AST: 13 U/L — ABNORMAL LOW (ref 15–41)
Albumin: 2.5 g/dL — ABNORMAL LOW (ref 3.5–5.0)
Alkaline Phosphatase: 111 U/L (ref 38–126)
Anion gap: 8 (ref 5–15)
BUN: 5 mg/dL — ABNORMAL LOW (ref 6–20)
CO2: 22 mmol/L (ref 22–32)
Calcium: 8.5 mg/dL — ABNORMAL LOW (ref 8.9–10.3)
Chloride: 107 mmol/L (ref 98–111)
Creatinine, Ser: 0.55 mg/dL (ref 0.44–1.00)
GFR, Estimated: >60 mL/min (ref >60)
Glucose, Bld: 86 mg/dL (ref 70–99)
Potassium: 4 mmol/L (ref 3.5–5.1)
Sodium: 137 mmol/L (ref 135–145)
Total Bilirubin: 0.5 mg/dL (ref 0.3–1.2)
Total Protein: 6.3 g/dL — ABNORMAL LOW (ref 6.5–8.1)

## 2021-08-19 LAB — CBC WITH DIFFERENTIAL/PLATELET
Abs Immature Granulocytes: 0.15 10*3/uL — ABNORMAL HIGH (ref 0.00–0.07)
Basophils Absolute: 0 10*3/uL (ref 0.0–0.1)
Basophils Relative: 0 %
Eosinophils Absolute: 0.1 10*3/uL (ref 0.0–0.5)
Eosinophils Relative: 1 %
HCT: 31.5 % — ABNORMAL LOW (ref 36.0–46.0)
Hemoglobin: 10.3 g/dL — ABNORMAL LOW (ref 12.0–15.0)
Immature Granulocytes: 1 %
Lymphocytes Relative: 15 %
Lymphs Abs: 2.1 10*3/uL (ref 0.7–4.0)
MCH: 30.5 pg (ref 26.0–34.0)
MCHC: 32.7 g/dL (ref 30.0–36.0)
MCV: 93.2 fL (ref 80.0–100.0)
Monocytes Absolute: 0.5 10*3/uL (ref 0.1–1.0)
Monocytes Relative: 4 %
Neutro Abs: 11.1 10*3/uL — ABNORMAL HIGH (ref 1.7–7.7)
Neutrophils Relative %: 79 %
Platelets: 408 10*3/uL — ABNORMAL HIGH (ref 150–400)
RBC: 3.38 MIL/uL — ABNORMAL LOW (ref 3.87–5.11)
RDW: 12.7 % (ref 11.5–15.5)
WBC: 14 10*3/uL — ABNORMAL HIGH (ref 4.0–10.5)
nRBC: 0 % (ref 0.0–0.2)

## 2021-08-19 LAB — URINALYSIS, ROUTINE W REFLEX MICROSCOPIC
Bilirubin, UA: NEGATIVE
Glucose, UA: NEGATIVE
Ketones, UA: NEGATIVE
Leukocytes,UA: NEGATIVE
Nitrite, UA: NEGATIVE
Protein,UA: NEGATIVE
RBC, UA: NEGATIVE
Specific Gravity, UA: 1.014 (ref 1.005–1.030)
Urobilinogen, Ur: 0.2 mg/dL (ref 0.2–1.0)
pH, UA: 7 (ref 5.0–7.5)

## 2021-08-19 MED ORDER — KETOROLAC TROMETHAMINE 60 MG/2ML IM SOLN
60.0000 mg | Freq: Once | INTRAMUSCULAR | Status: DC
  Filled 2021-08-19: qty 2

## 2021-08-19 MED ORDER — KETOROLAC TROMETHAMINE 30 MG/ML IJ SOLN
30.0000 mg | Freq: Once | INTRAMUSCULAR | Status: AC
  Administered 2021-08-19: 22:00:00 30 mg via INTRAVENOUS
  Filled 2021-08-19: qty 1

## 2021-08-19 MED ORDER — KETOROLAC TROMETHAMINE 30 MG/ML IJ SOLN
30.0000 mg | Freq: Once | INTRAMUSCULAR | Status: DC
  Filled 2021-08-19: qty 1

## 2021-08-19 NOTE — MAU Note (Addendum)
PT SAYS SHE DEL BY C/S ON 12-10 ( CAME HERE FOR AN INDUCTION ON 12-9, THEN  TURNED OUT FOR C/S )  WENT HOME ON TUES 13TH - Thursday - FELT HOT - TOOK 2 IBUPROFEN - BETTER WENT TO CLINIC YESTERDAY - TEMP WAS 100.? -  HAS PAIN ABOVE AND IN THE INCISION- [facility]

## 2021-08-19 NOTE — Telephone Encounter (Signed)
-----   Message from Gerome Apley, RN sent at 08/18/2021  3:20 PM EST ----- Regarding: call pt Having issues with pumping/ milk/ etc. Please call her to discuss and see if she needs to come in for appt with you Bonita Quin

## 2021-08-19 NOTE — MAU Provider Note (Incomplete Revision)
History     CSN: [id number]  Arrival date and time: 08/19/21 1918   Event Date/Time   First Provider Initiated Contact with Patient 08/19/21 2044      Chief Complaint  Patient presents with   Abdominal Pain   HPI Kristin Foster is a 25 y.o. G3P2012 at 10 days postpartum who presents with abdominal pain. Reports continued pain since her c/section on 12/10 that has gradually worsened. Pain is throughout lower abdomen but worse on the right side. Rates pain 8/10. Pain is constant aching that is worse with movement & sitting. Medication prescribed from hospital (roxicodone) helped with pain. Was prescribed percocet yesterday - states medication is not as effective. Took last dose around 5 pm. States her temp was up to 100 yesterday. No drainage or bleeding from incision. Last BM was today. Reports no issue with constipation or diarrhea. Reports new onset breast pain since arrival to MAU. Is breast feeding & pumping. Last pumped at 5 pm.   OB History     Gravida  3   Para  2   Term  2   Preterm  0   AB  1   Living  2      SAB  1   IAB  0   Ectopic  0   Multiple  0   Live Births  2           Past Medical History:  Diagnosis Date   Migraines     Past Surgical History:  Procedure Laterality Date   CESAREAN SECTION     CESAREAN SECTION N/A 08/09/2021   Procedure: CESAREAN SECTION;  Surgeon: Hermina Staggers, MD;  Location: [facility];  Service: Obstetrics;  Laterality: N/A;    Family History  Problem Relation Age of Onset   Hypertension Maternal Grandfather     Social History   Tobacco Use   Smoking status: Every Day    Types: E-cigarettes   Smokeless tobacco: Never  Vaping Use   Vaping Use: Every day   Substances: Nicotine  Substance Use Topics   Alcohol use: Never   Drug use: Never    Allergies: No Known Allergies  Medications Prior to Admission  Medication Sig Dispense Refill Last Dose   acetaminophen (TYLENOL) 500 MG tablet Take 2 tablets  (1,000 mg total) by mouth every 8 (eight) hours as needed (pain). 60 tablet 0 08/19/2021   amoxicillin-clavulanate (AUGMENTIN) 875-125 MG tablet Take 1 tablet by mouth 2 (two) times daily for 7 days. 14 tablet 0 08/19/2021   ferrous sulfate 325 (65 FE) MG tablet Take 1 tablet (325 mg total) by mouth every other day. 30 tablet 1 08/19/2021   ibuprofen (ADVIL) 600 MG tablet Take 1 tablet (600 mg total) by mouth every 6 (six) hours. 40 tablet 0 Past Week   oxyCODONE-acetaminophen (PERCOCET/ROXICET) 5-325 MG tablet Take 1 tablet by mouth every 6 (six) hours as needed. 15 tablet 0 08/19/2021   norethindrone (MICRONOR) 0.35 MG tablet Take 1 tablet (0.35 mg total) by mouth daily. (For birth control) (Patient not taking: Reported on 08/18/2021) 28 tablet 5 Unknown   oxyCODONE (OXY IR/ROXICODONE) 5 MG immediate release tablet Take 1-2 tablets (5-10 mg total) by mouth every 4 (four) hours as needed for severe pain or breakthrough pain. (Patient not taking: Reported on 08/18/2021) 30 tablet 0 Unknown   Prenatal Vit-Fe Fumarate-FA (PRENATAL VITAMINS PO) Take 1 tablet by mouth daily. (Patient not taking: Reported on 08/18/2021)   Unknown  Review of Systems  Constitutional: Negative.   Gastrointestinal:  Positive for abdominal pain. Negative for constipation, diarrhea, nausea and vomiting.  Genitourinary: Negative.   Physical Exam   Blood pressure 116/71, pulse (!) 54, temperature 99.3 F (37.4 C), temperature source Oral, resp. rate 20, height 5\' 1"  (1.549 m), weight 74.1 kg, SpO2 100 %, unknown if currently breastfeeding.  Physical Exam Vitals and nursing note reviewed.  Constitutional:      General: She is not in acute distress.    Appearance: She is well-developed. She is not ill-appearing or toxic-appearing.  HENT:     Head: Normocephalic and atraumatic.  Eyes:     General: No scleral icterus.    Extraocular Movements: Extraocular movements intact.     Pupils: Pupils are equal, round, and  reactive to light.  Pulmonary:     Effort: Pulmonary effort is normal. No respiratory distress.  Chest:     Comments: Bilateral breasts engorged. No masses. No signs of infection or abscess.  Abdominal:     General: Bowel sounds are decreased. There is no distension.     Palpations: Abdomen is soft.     Tenderness: There is abdominal tenderness in the right lower quadrant and left lower quadrant. There is guarding.     Comments: Incision well healing & approximated - no drainage  Skin:    General: Skin is warm and dry.  Neurological:     General: No focal deficit present.     Mental Status: She is alert.  Psychiatric:        Mood and Affect: Mood normal.        Behavior: Behavior normal.    MAU Course  Procedures Results for orders placed or performed during the hospital encounter of 08/19/21 (from the past 24 hour(s))  CBC with Differential/Platelet     Status: Abnormal   Collection Time: 08/19/21  9:24 PM  Result Value Ref Range   WBC 14.0 (H) 4.0 - 10.5 K/uL   RBC 3.38 (L) 3.87 - 5.11 MIL/uL   Hemoglobin 10.3 (L) 12.0 - 15.0 g/dL   HCT 08/21/21 (L) 68.3 - 41.9 %   MCV 93.2 80.0 - 100.0 fL   MCH 30.5 26.0 - 34.0 pg   MCHC 32.7 30.0 - 36.0 g/dL   RDW 62.2 29.7 - 98.9 %   Platelets 408 (H) 150 - 400 K/uL   nRBC 0.0 0.0 - 0.2 %   Neutrophils Relative % 79 %   Neutro Abs 11.1 (H) 1.7 - 7.7 K/uL   Lymphocytes Relative 15 %   Lymphs Abs 2.1 0.7 - 4.0 K/uL   Monocytes Relative 4 %   Monocytes Absolute 0.5 0.1 - 1.0 K/uL   Eosinophils Relative 1 %   Eosinophils Absolute 0.1 0.0 - 0.5 K/uL   Basophils Relative 0 %   Basophils Absolute 0.0 0.0 - 0.1 K/uL   Immature Granulocytes 1 %   Abs Immature Granulocytes 0.15 (H) 0.00 - 0.07 K/uL   No results found.  MDM PP patient presenting with worsening abdominal pain. Elevated temp & white count. Very tender to palpation. CT ordered.  IV toradol ordered for pain management while waiting for CT.   Breast pump provided while in  MAU  Care turned over to [facility], NP 08/19/2021 9:59 PM   Assessment and Plan   Reassessment (11:27 PM)

## 2021-08-19 NOTE — MAU Provider Note (Addendum)
History     CSN: [id number]  Arrival date and time: 08/19/21 1918   Event Date/Time   First Provider Initiated Contact with Patient 08/19/21 2044      Chief Complaint  Patient presents with   Abdominal Pain   HPI Kristin Foster is a 26 y.o. G3P2012 at 10 days postpartum who presents with abdominal pain. Reports continued pain since her c/section on 12/10 that has gradually worsened. Pain is throughout lower abdomen but worse on the right side. Rates pain 8/10. Pain is constant aching that is worse with movement & sitting. Medication prescribed from hospital (roxicodone) helped with pain. Was prescribed percocet yesterday - states medication is not as effective. Took last dose around 5 pm. States her temp was up to 100 yesterday. No drainage or bleeding from incision. Last BM was today. Reports no issue with constipation or diarrhea. Reports new onset breast pain since arrival to MAU. Is breast feeding & pumping. Last pumped at 5 pm.   OB History     Gravida  3   Para  2   Term  2   Preterm  0   AB  1   Living  2      SAB  1   IAB  0   Ectopic  0   Multiple  0   Live Births  2           Past Medical History:  Diagnosis Date   Migraines     Past Surgical History:  Procedure Laterality Date   CESAREAN SECTION     CESAREAN SECTION N/A 08/09/2021   Procedure: CESAREAN SECTION;  Surgeon: Hermina Staggers, MD;  Location: [facility] LD [facility]S;  Service: Obstetrics;  Laterality: N/A;    Family History  Problem Relation Age of Onset   Hypertension Maternal Grandfather     Social History   Tobacco Use   Smoking status: Every Day    Types: E-cigarettes   Smokeless tobacco: Never  Vaping Use   Vaping Use: Every day   Substances: Nicotine  Substance Use Topics   Alcohol use: Never   Drug use: Never    Allergies: No Known Allergies  Medications Prior to Admission  Medication Sig Dispense Refill Last Dose   acetaminophen (TYLENOL) 500 MG tablet Take 2 tablets  (1,000 mg total) by mouth every 8 (eight) hours as needed (pain). 60 tablet 0 08/19/2021   amoxicillin-clavulanate (AUGMENTIN) 875-125 MG tablet Take 1 tablet by mouth 2 (two) times daily for 7 days. 14 tablet 0 08/19/2021   ferrous sulfate 325 (65 FE) MG tablet Take 1 tablet (325 mg total) by mouth every other day. 30 tablet 1 08/19/2021   ibuprofen (ADVIL) 600 MG tablet Take 1 tablet (600 mg total) by mouth every 6 (six) hours. 40 tablet 0 Past Week   oxyCODONE-acetaminophen (PERCOCET/ROXICET) 5-325 MG tablet Take 1 tablet by mouth every 6 (six) hours as needed. 15 tablet 0 08/19/2021   norethindrone (MICRONOR) 0.35 MG tablet Take 1 tablet (0.35 mg total) by mouth daily. (For birth control) (Patient not taking: Reported on 08/18/2021) 28 tablet 5 Unknown   oxyCODONE (OXY IR/ROXICODONE) 5 MG immediate release tablet Take 1-2 tablets (5-10 mg total) by mouth every 4 (four) hours as needed for severe pain or breakthrough pain. (Patient not taking: Reported on 08/18/2021) 30 tablet 0 Unknown   Prenatal Vit-Fe Fumarate-FA (PRENATAL VITAMINS PO) Take 1 tablet by mouth daily. (Patient not taking: Reported on 08/18/2021)   Unknown  Review of Systems  Constitutional: Negative.   Gastrointestinal:  Positive for abdominal pain. Negative for constipation, diarrhea, nausea and vomiting.  Genitourinary: Negative.   Physical Exam   Blood pressure 116/71, pulse (!) 54, temperature 99.3 F (37.4 C), temperature source Oral, resp. rate 20, height 5\' 1"  (1.549 m), weight 74.1 kg, SpO2 100 %, unknown if currently breastfeeding.  Physical Exam Vitals and nursing note reviewed.  Constitutional:      General: She is not in acute distress.    Appearance: She is well-developed. She is not ill-appearing or toxic-appearing.  HENT:     Head: Normocephalic and atraumatic.  Eyes:     General: No scleral icterus.    Extraocular Movements: Extraocular movements intact.     Pupils: Pupils are equal, round, and  reactive to light.  Pulmonary:     Effort: Pulmonary effort is normal. No respiratory distress.  Chest:     Comments: Bilateral breasts engorged. No masses. No signs of infection or abscess.  Abdominal:     General: Bowel sounds are decreased. There is no distension.     Palpations: Abdomen is soft.     Tenderness: There is abdominal tenderness in the right lower quadrant and left lower quadrant. There is guarding.     Comments: Incision well healing & approximated - no drainage  Skin:    General: Skin is warm and dry.  Neurological:     General: No focal deficit present.     Mental Status: She is alert.  Psychiatric:        Mood and Affect: Mood normal.        Behavior: Behavior normal.    MAU Course  Procedures Results for orders placed or performed during the hospital encounter of 08/19/21 (from the past 24 hour(s))  CBC with Differential/Platelet     Status: Abnormal   Collection Time: 08/19/21  9:24 PM  Result Value Ref Range   WBC 14.0 (H) 4.0 - 10.5 K/uL   RBC 3.38 (L) 3.87 - 5.11 MIL/uL   Hemoglobin 10.3 (L) 12.0 - 15.0 g/dL   HCT 08/21/21 (L) 85.0 - 27.7 %   MCV 93.2 80.0 - 100.0 fL   MCH 30.5 26.0 - 34.0 pg   MCHC 32.7 30.0 - 36.0 g/dL   RDW 41.2 87.8 - 67.6 %   Platelets 408 (H) 150 - 400 K/uL   nRBC 0.0 0.0 - 0.2 %   Neutrophils Relative % 79 %   Neutro Abs 11.1 (H) 1.7 - 7.7 K/uL   Lymphocytes Relative 15 %   Lymphs Abs 2.1 0.7 - 4.0 K/uL   Monocytes Relative 4 %   Monocytes Absolute 0.5 0.1 - 1.0 K/uL   Eosinophils Relative 1 %   Eosinophils Absolute 0.1 0.0 - 0.5 K/uL   Basophils Relative 0 %   Basophils Absolute 0.0 0.0 - 0.1 K/uL   Immature Granulocytes 1 %   Abs Immature Granulocytes 0.15 (H) 0.00 - 0.07 K/uL   CT ABDOMEN PELVIS W CONTRAST  Result Date: 08/20/2021 CLINICAL DATA:  Abdominal pain. Status post Caesarean section 08/09/2021. EXAM: CT ABDOMEN AND PELVIS WITH CONTRAST TECHNIQUE: Multidetector CT imaging of the abdomen and pelvis was  performed using the standard protocol following bolus administration of intravenous contrast. CONTRAST:  14/05/2021 OMNIPAQUE IOHEXOL 300 MG/ML  SOLN COMPARISON:  None. FINDINGS: Lower chest: Tiny bilateral pleural effusions. Hepatobiliary: No suspicious focal abnormality within the liver parenchyma. Small area of low attenuation in the anterior liver, adjacent to the falciform  ligament, is in a characteristic location for focal fatty deposition. Trace periportal edema evident. Gallbladder is distended with some trace pericholecystic fluid. No calcified gallstones by CT imaging. No intrahepatic or extrahepatic biliary dilation. Pancreas: No focal mass lesion. No dilatation of the main duct. No intraparenchymal cyst. No peripancreatic edema. Spleen: No splenomegaly. No focal mass lesion. Adrenals/Urinary Tract: No adrenal nodule or mass. Kidneys unremarkable. No evidence for hydroureter. The urinary bladder appears normal for the degree of distention. Stomach/Bowel: Stomach is unremarkable. No gastric wall thickening. No evidence of outlet obstruction. Duodenum is normally positioned as is the ligament of Treitz. No small bowel wall thickening. No small bowel dilatation. The terminal ileum is normal. The appendix is normal. No gross colonic mass. No colonic wall thickening. Vascular/Lymphatic: No abdominal aortic aneurysm. No abdominal aortic atherosclerotic calcification. There is no gastrohepatic or hepatoduodenal ligament lymphadenopathy. No retroperitoneal or mesenteric lymphadenopathy. No pelvic sidewall lymphadenopathy. Reproductive: Enlarged uterus compatible with postpartum state. 7.2 x 2.8 x 3.6 cm collection is identified in the anterior lower uterine segment at the site of C-section, extending anteriorly into the space between the bladder and anterior uterus. CT imaging features compatible with bladder flap hematoma. Given timing in the peripheral and irregular internal enhancement, superinfection is a distinct  concern. There is no adnexal mass. Other: There is trace free fluid in the cul-de-sac and around the liver. Musculoskeletal: 2.1 x 1.2 x 2.4 cm irregular rim enhancing collection is identified in the low rectus sheath, just cranial to the level of the low transverse incision. IMPRESSION: 1. 7.2 x 2.8 x 3.6 cm collection in the anterior lower uterine segment at the site of C-section, extending anteriorly into the space between the bladder and anterior uterus. CT imaging features compatible with bladder flap hematoma. Given timing and the irregular internal and peripheral enhancement, superinfection is a distinct concern. 2. 2.1 x 1.2 x 2.4 cm irregular rim enhancing collection in the low rectus sheath, just cranial to the level of the low transverse incision. Small abscess not excluded. 3. Trace free fluid in the cul-de-sac and around the liver. 4. Tiny bilateral pleural effusions. Electronically Signed   By: Kennith Center M.D.   On: 08/20/2021 06:15    MDM PP patient presenting with worsening abdominal pain. Elevated temp & white count. Very tender to palpation. CT ordered.  IV toradol ordered for pain management while waiting for CT.   Breast pump provided while in MAU  Care turned over to [facility] Judeth Horn, NP 08/19/2021 9:59 PM   Assessment and Plan   Reassessment (11:27 PM) -Nurse reports patient requesting update. -Provider to bedside and informed patient still awaiting for CT. -Patient states she was unaware that this was the plan! Provider confirms that it is.  -Cautioned that critical/trauma patients take priority over non-emergent patient's such as herself.  -Informed that wait time is unknown for provider and that after completion additional wait time necessary for radiology review. -Patient verbalizes understanding. -Reports pain now 4-5/10 depending upon movement. -Patient requests water and instructed to remain NPO until results available. -No other  questions.  Reassessment (6:25 AM)  -Results return as above. -Dr. Marquis Lunch. Anyanwu consulted and informed of patient status, evaluation, interventions, and results. Advised: *No new interventions. *Patient to continue antibiotic and pain medication as prescribed. *Provide education as appropriate.  -Provider to bedside to discuss results and management. -Instructed to continue antibiotics and pain medication as prescribed. -Reviewed return precautions including fever, worsening pain despite medication, or onset of new  symptoms. -Patient requesting given pain medication, Toradol, via IV. --Discharged to home in stable condition.   Cherre Robins MSN, CNM Advanced Practice Provider, [facility] [employer]

## 2021-08-19 NOTE — Telephone Encounter (Signed)
Returned patients call.   Patient reports that she would like me to call her again at a later time.

## 2021-08-20 ENCOUNTER — Inpatient Hospital Stay (HOSPITAL_COMMUNITY): Payer: Medicaid Other

## 2021-08-20 DIAGNOSIS — R109 Unspecified abdominal pain: Secondary | ICD-10-CM | POA: Diagnosis not present

## 2021-08-20 DIAGNOSIS — G8918 Other acute postprocedural pain: Secondary | ICD-10-CM | POA: Diagnosis not present

## 2021-08-20 LAB — URINE CULTURE, OB REFLEX

## 2021-08-20 LAB — CULTURE, OB URINE

## 2021-08-20 MED ORDER — KETOROLAC TROMETHAMINE 30 MG/ML IJ SOLN
30.0000 mg | Freq: Once | INTRAMUSCULAR | Status: AC
  Administered 2021-08-20: 07:00:00 30 mg via INTRAVENOUS
  Filled 2021-08-20: qty 1

## 2021-08-20 MED ORDER — IOHEXOL 300 MG/ML  SOLN
100.0000 mL | Freq: Once | INTRAMUSCULAR | Status: AC | PRN
  Administered 2021-08-20: 06:00:00 100 mL via INTRAVENOUS

## 2021-08-20 NOTE — Telephone Encounter (Signed)
Call was disconnected 3-4 times. Called patient again and went to voicemail again. Will try again later.

## 2021-08-20 NOTE — Telephone Encounter (Signed)
Called patient to discuss Lactation concerns. Call was disconnected and called back and went to voicemail.

## 2021-08-21 ENCOUNTER — Telehealth (HOSPITAL_COMMUNITY): Payer: Self-pay

## 2021-08-21 NOTE — Telephone Encounter (Signed)
"  I've had some complications since we have been home from the hospital. I have been following up with my OB-GYN, I have no questions." Patient declines questions or concerns about her healing.  "Baby is doing fine with his feedings. He is peeing and pooping. He has been following up with his pediatrician as well. He sleeps in a crib." RN reviewed ABC's of safe sleep with patient. Patient declines any questions or concerns about baby.  EPDS score is 7.  Marcelino [facility] 08/21/2021,1951

## 2021-08-21 NOTE — Telephone Encounter (Signed)
Called patient to discuss Lactation Concerns. She did not answer. LM for patient to call the office at [phone] at her convenience if she is still having concerns with Lactation.

## 2021-08-26 ENCOUNTER — Ambulatory Visit (INDEPENDENT_AMBULATORY_CARE_PROVIDER_SITE_OTHER): Payer: Medicaid Other

## 2021-08-26 ENCOUNTER — Other Ambulatory Visit: Payer: Self-pay

## 2021-08-26 VITALS — BP 102/68 | HR 91 | Temp 98.0°F | Wt 149.5 lb

## 2021-08-26 DIAGNOSIS — Z5189 Encounter for other specified aftercare: Secondary | ICD-10-CM

## 2021-08-26 DIAGNOSIS — O8600 Infection of obstetric surgical wound, unspecified: Secondary | ICD-10-CM

## 2021-08-26 MED ORDER — IBUPROFEN 600 MG PO TABS: 600.0000 mg | ORAL_TABLET | Freq: Four times a day (QID) | ORAL | 3 refills | Status: DC | PRN

## 2021-08-26 MED ORDER — ACETAMINOPHEN 500 MG PO TABS: 1000.0000 mg | ORAL_TABLET | Freq: Four times a day (QID) | ORAL | 1 refills | Status: DC | PRN

## 2021-08-26 MED ORDER — OXYCODONE HCL 5 MG PO TABS: 5.0000 mg | ORAL_TABLET | ORAL | 0 refills | Status: DC | PRN

## 2021-08-26 NOTE — Progress Notes (Signed)
Incision Check Visit  Kristin Foster is here for incision check following repeat c-section on 08/09/21. Given Augmentin BID for 7 days on 08/18/21 for possible wound infection. Pt seen most recently in MAU for abdominal pain directly above c-section on 08/19/21. Incision today is clean and dry. 1 cm long open area of pink, healing tissue. Some skin irritation surrounding open area. Steri strips applied to the area. Pt reports pain is only controlled with Percocet. Requests refill. Reviewed with Para March, MD who states patient should rotate Tylenol and ibuprofen, oxycodone sent for breakthrough pain. Provider gives verbal order for CBC to recheck previously elevated white blood cell count. Provider recommendation reviewed with patient. S/s of infection reviewed. Pt will return for PP visit on 09/10/21 or will contact office with any concerns before that time.   Marjo Bicker, RN 08/26/2021  2:18 PM

## 2021-08-26 NOTE — Progress Notes (Signed)
Patient was assessed and managed by nursing staff during this encounter. I have reviewed the chart and agree with the documentation and plan. I have also made any necessary editorial changes. Augmentin given to cover possible UTI, endometritis or cellullitis although skin looks fine. F/u ucx. Additional oxycodone and ED precautions given   [place] Bing, MD 08/26/2021 11:52 AM

## 2021-08-27 LAB — CBC
Hematocrit: 40.7 % (ref 34.0–46.6)
Hemoglobin: 13.2 g/dL (ref 11.1–15.9)
MCH: 29.3 pg (ref 26.6–33.0)
MCHC: 32.4 g/dL (ref 31.5–35.7)
MCV: 90 fL (ref 79–97)
Platelets: 521 10*3/uL — ABNORMAL HIGH (ref 150–450)
RBC: 4.51 x10E6/uL (ref 3.77–5.28)
RDW: 12.2 % (ref 11.7–15.4)
WBC: 6.6 10*3/uL (ref 3.4–10.8)

## 2021-08-27 NOTE — Progress Notes (Signed)
Agree with assessment. CBC improved with WBC of 6. Pt not doing ibuprofen and tylenol together. Ran out of ibuprofen. Will have her do ibu/tylenol together and then oxycodone for breakthrough only.

## 2021-09-03 ENCOUNTER — Telehealth: Payer: Self-pay | Admitting: General Practice

## 2021-09-03 ENCOUNTER — Ambulatory Visit: Payer: Medicaid Other

## 2021-09-03 NOTE — Telephone Encounter (Signed)
Patient called into front office reporting concern about her incision. She states since yesterday her incision has been a lot more itchy and has felt increased pain/pressure. Patient denies recent change in activity. Asked patient to look at incision and she states it looks more swollen to her. Offered incision check appt today at 4pm. Patient verbalized understanding & states she will be here then.

## 2021-09-04 ENCOUNTER — Other Ambulatory Visit: Payer: Self-pay | Admitting: Lactation Services

## 2021-09-04 ENCOUNTER — Encounter: Payer: Self-pay | Admitting: Lactation Services

## 2021-09-04 ENCOUNTER — Ambulatory Visit (INDEPENDENT_AMBULATORY_CARE_PROVIDER_SITE_OTHER): Payer: Medicaid Other

## 2021-09-04 ENCOUNTER — Other Ambulatory Visit: Payer: Self-pay

## 2021-09-04 VITALS — BP 103/71 | HR 95

## 2021-09-04 DIAGNOSIS — R300 Dysuria: Secondary | ICD-10-CM

## 2021-09-04 DIAGNOSIS — Z5189 Encounter for other specified aftercare: Secondary | ICD-10-CM

## 2021-09-04 MED ORDER — METOCLOPRAMIDE HCL 10 MG PO TABS: 10.0000 mg | ORAL_TABLET | Freq: Three times a day (TID) | ORAL | 0 refills | Status: DC

## 2021-09-04 NOTE — Progress Notes (Signed)
Patient requested to see Lactation while in the office with concerns with milk supply. She had complications with infection to C-Section and took ATB.   Patient reports she had a lot of milk (10 ounces per pumping) after infant delivered and now has none. Patient is trying to pump every 4 hours, getting 4-6 pumpings a day. She is using a Medela Manual and Electric pump, she uses the manual pump mostly. She uses # 24 flanges with pumping. She has a lot of tissue that pulls into barrel, reviewed changing to # 21 flanges for pumping. She sometimes latches infant and sometimes does not. Infant is being fed breast milk and supplementing with formula a few times a day. She also has a Furniture conservator/restorer DEBP at home that she has not used.   She would like recommendations for how to increase milk supply.   Reviewed supply and demand and importance of emptying the breast the breast with baby and then the pump to increase milk supply Reviewed fluid intake and caloric intake. She is not eating well for the last week.   Reviewed Fenugreek, Moringa, Legendairy Milk Fenugreek Free Products and Reglan. Reviewed side effects, dosages, and side effects. Handout given. Reviewed that no supplement will work without emptying or stimulating the breast.   Mom to call back with any questions or concerns as needed

## 2021-09-04 NOTE — Patient Instructions (Addendum)
Make sure to empty the breast at least 8 times a day. Preferably latch the baby first or pump if infant is not latching to promote and protect milk supply. Use your double electric breast pump.   Pump for 10-15 minutes if infant has latched Pump for 15-20 minutes if infant is not latching Change to # 21 flanges with pumping Keep up the good work Please call with any questions or concerns as needed [phone] Thank you for allowing me to assist you today Follow up with Lactation as needed

## 2021-09-04 NOTE — Progress Notes (Signed)
Patient requested Relgan for milk production after meeting with Lactation earlier. Sent to Pharmacy per Dr. Kennon Rounds.

## 2021-09-05 LAB — POCT URINALYSIS DIP (DEVICE)
Bilirubin Urine: NEGATIVE
Glucose, UA: NEGATIVE mg/dL
Ketones, ur: NEGATIVE mg/dL
Leukocytes,Ua: NEGATIVE
Nitrite: NEGATIVE
Protein, ur: NEGATIVE mg/dL
Specific Gravity, Urine: 1.015 (ref 1.005–1.030)
Urobilinogen, UA: 0.2 mg/dL (ref 0.0–1.0)
pH: 6 (ref 5.0–8.0)

## 2021-09-05 NOTE — Progress Notes (Signed)
Pt here for incision check following c-section on 08/09/21. Steristrips removed. Incision is clean, dry, and fully intact. No redness, warmth, or swelling to surrounding area. Pt reports itching at incision site. Reports ongoing abdominal pain. Pt has finished oxycodone. Encouraged pt to continue rotating Tylenol and ibuprofen for pain. Pt is not having daily BM but attributes this to minimal eating and drinking. Recommended pt trial Miralax daily to see if this relieves any abdominal discomfort. Reports burning at end of urine stream. UA is negative. Pt will follow up at [facility] appt on 09/10/21. Pt escorted to Lactation Consultant office for help with breast feeding concerns.  Fleet Contras RN 09/04/21

## 2021-09-05 NOTE — Progress Notes (Signed)
Patient seen and assessed by nursing staff.  Agree with documentation and plan.  

## 2021-09-08 ENCOUNTER — Ambulatory Visit: Payer: Medicaid Other

## 2021-09-09 NOTE — Progress Notes (Signed)
Post Partum Visit Note  Kristin Foster is a 26 y.o. [id number] female who presents for a postpartum visit. She is 4 weeks postpartum following a repeat cesarean section.  I have fully reviewed the prenatal and intrapartum course. The delivery was at 40.3 gestational weeks.  Anesthesia: spinal. Postpartum course has been uneventful. Baby is doing well. Baby is feeding by both breast and bottle - Similac Advance. Bleeding no bleeding. Bowel function is normal. Bladder function is normal. Patient is not sexually active. Contraception method is oral progesterone-only contraceptive. Postpartum depression screening: negative.   The pregnancy intention screening data noted above was reviewed. Potential methods of contraception were discussed. The patient elected to proceed with No data recorded.   Edinburgh Postnatal Depression Scale - 09/10/21 0847       Edinburgh Postnatal Depression Scale:  In the Past 7 Days   I have been able to laugh and see the funny side of things. 0    I have looked forward with enjoyment to things. 0    I have blamed myself unnecessarily when things went wrong. 0    I have been anxious or worried for no good reason. 0    I have felt scared or panicky for no good reason. 0    Things have been getting on top of me. 0    I have been so unhappy that I have had difficulty sleeping. 0    I have felt sad or miserable. 0    I have been so unhappy that I have been crying. 0    The thought of harming myself has occurred to me. 0    Edinburgh Postnatal Depression Scale Total 0             Health Maintenance Due  Topic Date Due   HPV VACCINES (3 - 3-dose series) 12/25/2017    The following portions of the patient's history were reviewed and updated as appropriate: allergies, current medications, past family history, past medical history, past social history, past surgical history, and problem list.  Review of Systems Pertinent items noted in HPI and remainder of  comprehensive ROS otherwise negative.  Objective:  BP 95/68    Pulse 91    Wt 149 lb 6.4 oz (67.8 kg)    LMP  (LMP Unknown)    Breastfeeding Yes    BMI 28.23 kg/m    General:  alert, cooperative, and appears stated age   Breasts:  not indicated  Lungs: Comfortable on room air       Assessment:    There are no diagnoses linked to this encounter.  Normal postpartum exam.   Plan:   Essential components of care per ACOG recommendations:  1.  Mood and well being: Patient with negative depression screening today. Reviewed local resources for support.  - Patient tobacco use? Yes, vaping  - hx of drug use? No.    2. Infant care and feeding:  -Patient currently breastmilk feeding? Yes. Reviewed importance of draining breast regularly to support lactation.  -Social determinants of health (SDOH) reviewed in EPIC. No concerns  3. Sexuality, contraception and birth spacing - Patient does not want a pregnancy in the next year.  - Reviewed forms of contraception in tiered fashion. Patient desired  Nexplanon  today after counseling. See separate procedure note.  - Discussed birth spacing of 18 months  4. Sleep and fatigue -Encouraged family/partner/community support of 4 hrs of uninterrupted sleep to help with mood and fatigue  5. Physical  Recovery  - Discussed patients delivery and complications. She describes her labor as mixed. - Patient had a C-section failure to progress.  - Patient has urinary incontinence? No. - Patient is safe to resume physical and sexual activity  6.  Health Maintenance - HM due items addressed No - up to date - Last pap smear  Diagnosis  Date Value Ref Range Status  05/28/2021   Final   - Negative for intraepithelial lesion or malignancy (NILM)   Pap smear not done at today's visit.  -Breast Cancer screening indicated? No.   7. Chronic Disease/Pregnancy Condition follow up: None - PCP follow up  Venora Maples, MD Center for [facility],  [facility]

## 2021-09-10 ENCOUNTER — Encounter: Payer: Self-pay | Admitting: Family Medicine

## 2021-09-10 ENCOUNTER — Ambulatory Visit (INDEPENDENT_AMBULATORY_CARE_PROVIDER_SITE_OTHER): Payer: Medicaid Other | Admitting: Family Medicine

## 2021-09-10 ENCOUNTER — Other Ambulatory Visit: Payer: Self-pay

## 2021-09-10 DIAGNOSIS — Z30017 Encounter for initial prescription of implantable subdermal contraceptive: Secondary | ICD-10-CM | POA: Insufficient documentation

## 2021-09-10 DIAGNOSIS — Z5189 Encounter for other specified aftercare: Secondary | ICD-10-CM

## 2021-09-10 DIAGNOSIS — Z3046 Encounter for surveillance of implantable subdermal contraceptive: Secondary | ICD-10-CM | POA: Insufficient documentation

## 2021-09-10 MED ORDER — ETONOGESTREL 68 MG ~~LOC~~ IMPL
68.0000 mg | DRUG_IMPLANT | Freq: Once | SUBCUTANEOUS | Status: AC
  Administered 2021-09-10: 68 mg via SUBCUTANEOUS

## 2021-09-10 MED ORDER — IBUPROFEN 600 MG PO TABS: 600.0000 mg | ORAL_TABLET | Freq: Four times a day (QID) | ORAL | 0 refills | Status: DC | PRN

## 2021-09-10 NOTE — Addendum Note (Signed)
Addended by: Isabell Jarvis on: 09/10/2021 10:07 AM   Modules accepted: Orders

## 2021-09-10 NOTE — Progress Notes (Signed)
° ° ° °  GYNECOLOGY OFFICE PROCEDURE NOTE  Kristin Foster is a 26 y.o. [id number] here for Nexplanon insertion.  Last pap smear was on  Lab Results  Component Value Date   DIAGPAP  05/28/2021    - Negative for intraepithelial lesion or malignancy (NILM)    Nexplanon insertion Procedure Patient identified, informed consent performed, consent signed.   Patient does understand that irregular bleeding is a very common side effect of this medication. She was advised to have backup contraception for one week after placement. Pregnancy test in clinic today was negative.  Appropriate time out taken.  Patient's left arm was prepped and draped in the usual sterile fashion. The ruler used to measure and mark insertion area.  Patient was prepped with alcohol swab and then injected with 3 ml of 1% lidocaine.  She was prepped with betadine, Nexplanon removed from packaging,  Device confirmed in needle, then inserted full length of needle and withdrawn per handbook instructions. Nexplanon was able to palpated in the patient's arm; patient palpated the insert herself. There was minimal blood loss.  Patient insertion site covered with guaze and a pressure bandage to reduce any bruising.  The patient tolerated the procedure well and was given post procedure instructions.  Venora Maples, MD/MPH Family Medicine, [employer] for [employer], [facility]

## 2021-09-10 NOTE — Addendum Note (Signed)
Addended by: Georgia Lopes on: 09/10/2021 11:43 AM   Modules accepted: Orders

## 2021-09-16 ENCOUNTER — Telehealth: Payer: Self-pay | Admitting: Family Medicine

## 2021-09-16 NOTE — Telephone Encounter (Signed)
Having pain with her Nexplanon

## 2021-09-17 NOTE — Telephone Encounter (Addendum)
Pt reports that the provider told her that she wouldn't;t have pain however she is having pain.  Pain has been there at the site since placement.  Pt states that she also had like blood on surface.  Pt states "you know when you get an IV and they don't get it that's what the site looks like."  I explained to the pt that sounds like you have a bruise at the site which would be normal for her have it recently inserted. Reviewed chart with Dione Plover, MD who requests pt to come in on 09/23/21.  Pt reports that she already has an appt that day.   I advised pt that the appt she has scheduled on 09/23/21 should be sufficient at this time.  I advised pt that if the pain increases or the site changes to please give [place] a call.  Pt verbalized understanding with no further questions.   Mel Almond, RN  09/18/21

## 2021-09-23 ENCOUNTER — Ambulatory Visit (INDEPENDENT_AMBULATORY_CARE_PROVIDER_SITE_OTHER): Payer: Medicaid Other | Admitting: Obstetrics and Gynecology

## 2021-09-23 ENCOUNTER — Other Ambulatory Visit: Payer: Self-pay

## 2021-09-23 ENCOUNTER — Encounter: Payer: Self-pay | Admitting: Obstetrics and Gynecology

## 2021-09-23 VITALS — BP 113/82 | HR 96 | Wt 146.3 lb

## 2021-09-23 DIAGNOSIS — Z975 Presence of (intrauterine) contraceptive device: Secondary | ICD-10-CM

## 2021-09-23 DIAGNOSIS — Z3046 Encounter for surveillance of implantable subdermal contraceptive: Secondary | ICD-10-CM

## 2021-09-23 DIAGNOSIS — N921 Excessive and frequent menstruation with irregular cycle: Secondary | ICD-10-CM

## 2021-09-23 MED ORDER — NORGESTIMATE-ETH ESTRADIOL 0.25-35 MG-MCG PO TABS: 1.0000 | ORAL_TABLET | Freq: Every day | ORAL | 1 refills | Status: DC

## 2021-09-24 DIAGNOSIS — Z975 Presence of (intrauterine) contraceptive device: Secondary | ICD-10-CM | POA: Insufficient documentation

## 2021-09-24 NOTE — Progress Notes (Signed)
Obstetrics and Gynecology Visit Return Patient Evaluation  Appointment Date: 09/23/2021  Primary Care Provider: Pcp, No  OBGYN Clinic: Center for [facility]  Chief Complaint: AUB. Nexplanon discomfort  History of Present Illness:  Kristin Foster is a 26 y.o. P2 s/p nexplanon insertion at her PP visit on 1/11 after a 12/10 rpt c/s.  She states she didn't have AUB until nexplanon was put in and since nexplanon was inserted she has discomfort when she uses her LUE and when she tries to sleep on that arm. She has not had sex since delivery  Review of Systems:  as noted in the History of Present Illness.  Patient Active Problem List   Diagnosis Date Noted   Nexplanon insertion 09/10/2021   Cesarean delivery delivered 08/11/2021   Fatty liver in mother during pregnancy 06/12/2021   Supervision of other normal pregnancy, antepartum 05/28/2021   Previous cesarean delivery affecting pregnancy, antepartum 05/28/2021   Medications:  Kristin Foster had no medications administered during this visit. Current Outpatient Medications  Medication Sig Dispense Refill   Prenatal Vit-Fe Fumarate-FA (PRENATAL VITAMINS PO) Take 1 tablet by mouth daily.     No current facility-administered medications for this visit.    Allergies: has No Known Allergies.  Physical Exam:  BP 113/82    Pulse 96    Wt 146 lb 4.8 oz (66.4 kg)    LMP 09/17/2021    Breastfeeding Yes    BMI 27.64 kg/m  Body mass index is 27.64 kg/m. General appearance: Well nourished, well developed female in no acute distress.  Ext: LUE with nexplanonin place in the subcu area about 8cm medial of the epicondyle on the underside of the arm. No e/o infection, it's not broken.  Neuro/Psych:  Normal mood and affect.     Assessment: pt stable  Plan:  1. Breakthrough bleeding on Nexplanon She only has with birth control methods. She did do well with OCPs but was told she couldn't do that with breastfeeding. I  told her that her milk supply should be established now so combined OCPs should be fine so she would like to try this and I also d/w her re: patch and ring in case she wants a non qday method.   I d/w her re: removing the nexplanon for aub and for the discomfort but she would like to do a month of OCPs and f/u to see how things are going. I told her that the location of the nexplanon could just be in a spot where it causes discomfort in her situation even though it's in the proper location   2. Encounter for surveillance of Nexplanon subdermal contraceptive See above   RTC: 6 weeks  Cornelia Copa MD Attending Center for [employer] [facility])

## 2021-10-02 ENCOUNTER — Encounter: Payer: Self-pay | Admitting: *Deleted

## 2021-11-04 ENCOUNTER — Other Ambulatory Visit: Payer: Self-pay

## 2021-11-04 ENCOUNTER — Encounter: Payer: Self-pay | Admitting: Family Medicine

## 2021-11-04 ENCOUNTER — Ambulatory Visit (INDEPENDENT_AMBULATORY_CARE_PROVIDER_SITE_OTHER): Payer: Medicaid Other | Admitting: Family Medicine

## 2021-11-04 VITALS — BP 104/75 | HR 98 | Wt 144.5 lb

## 2021-11-04 DIAGNOSIS — N921 Excessive and frequent menstruation with irregular cycle: Secondary | ICD-10-CM | POA: Diagnosis not present

## 2021-11-04 DIAGNOSIS — Z5189 Encounter for other specified aftercare: Secondary | ICD-10-CM

## 2021-11-04 DIAGNOSIS — Z975 Presence of (intrauterine) contraceptive device: Secondary | ICD-10-CM

## 2021-11-04 MED ORDER — IBUPROFEN 600 MG PO TABS: 600.0000 mg | ORAL_TABLET | Freq: Four times a day (QID) | ORAL | 5 refills | Status: DC | PRN

## 2021-11-04 MED ORDER — NORGESTIMATE-ETH ESTRADIOL 0.25-35 MG-MCG PO TABS: 1.0000 | ORAL_TABLET | Freq: Every day | ORAL | 11 refills | Status: AC

## 2021-11-04 NOTE — Assessment & Plan Note (Signed)
Reviewed options, at present I recommend she observe for time being and hopefully course of OCP's has stabilized enough she won't need it again. If she does bleed again though perfectly fine to use OCP's more regularly for control of bleeding and run packs together, rx given with refills. ?

## 2021-11-04 NOTE — Progress Notes (Signed)
? ?GYNECOLOGY OFFICE VISIT NOTE ? ?History:  ? Kristin Foster is a 26 y.o. [id number] here today for follow up of breakthrough bleeding on Nexplanon. ? ?Seen for PP visit on 09/10/2021, had nexplanon placed at that time ?Saw Dr. Ilda Basset on 09/22/2021, rx for OCP's ?She reports she took them and stopped bleeding afterwards, then resumed bleeding a few days after but stopped bleeding again 3 days ago ? ?Overall she is happy to continue with Nexplanon, as she has forgotten to take pills in the past ?However bleeding is very bothersome to her ?She has trialed IUD's in the past as well as Depo and did not like them due to bleeding issues ?Does not want to do permanent methods at present ? ?Health Maintenance Due  ?Topic Date Due  ? HPV VACCINES (3 - 3-dose series) 12/25/2017  ? ? ?Past Medical History:  ?Diagnosis Date  ? Migraines   ? ? ?Past Surgical History:  ?Procedure Laterality Date  ? CESAREAN SECTION    ? CESAREAN SECTION N/A 08/09/2021  ? Procedure: CESAREAN SECTION;  Surgeon: Chancy Milroy, MD;  Location: [facility] LD [facility]S;  Service: Obstetrics;  Laterality: N/A;  ? ? ?The following portions of the patient's history were reviewed and updated as appropriate: allergies, current medications, past family history, past medical history, past social history, past surgical history and problem list.  ? ?Health Maintenance:   ?Last pap: ?Lab Results  ?Component Value Date  ? DIAGPAP  05/28/2021  ?  - Negative for intraepithelial lesion or malignancy (NILM)  ? ? ?Last mammogram:  ?N/a  ? ? ?Review of Systems:  ?Pertinent items noted in HPI and remainder of comprehensive ROS otherwise negative. ? ?Physical Exam:  ?BP 104/75   Pulse 98   Wt 144 lb 8 oz (65.5 kg)   LMP  (LMP Unknown)   Breastfeeding Yes   BMI 27.30 kg/m?  ?CONSTITUTIONAL: Well-developed, well-nourished female in no acute distress.  ?HEENT:  Normocephalic, atraumatic. External right and left ear normal. No scleral icterus.  ?NECK: Normal range of motion, supple,  no masses noted on observation ?SKIN: No rash noted. Not diaphoretic. No erythema. No pallor. ?MUSCULOSKELETAL: Normal range of motion. No edema noted. ?NEUROLOGIC: Alert and oriented to person, place, and time. Normal muscle tone coordination.  ?PSYCHIATRIC: Normal mood and affect. Normal behavior. Normal judgment and thought content. ?RESPIRATORY: Effort normal, no problems with respiration noted ? ? ?Labs and Imaging ?No results found for this or any previous visit (from the past 168 hour(s)). ?No results found.    ?Assessment and Plan:  ? ?Problem List Items Addressed This Visit   ? ?  ? Other  ? Breakthrough bleeding on Nexplanon - Primary  ?  Reviewed options, at present I recommend she observe for time being and hopefully course of OCP's has stabilized enough she won't need it again. If she does bleed again though perfectly fine to use OCP's more regularly for control of bleeding and run packs together, rx given with refills. ?  ?  ? Relevant Medications  ? norgestimate-ethinyl estradiol (ORTHO-CYCLEN) 0.25-35 MG-MCG tablet  ? ?Other Visit Diagnoses   ? ? Visit for wound check      ? Relevant Medications  ? ibuprofen (ADVIL) 600 MG tablet  ? ?  ? ? ?Routine preventative health maintenance measures emphasized. ?Please refer to After Visit Summary for other counseling recommendations.  ? ?Return in about 1 year (around 11/05/2022) for Annual Wellness Visit.   ? ?Total face-to-face time with patient:  15 minutes.  Over 50% of encounter was spent on counseling and coordination of care. ? ? ?Clarnce Flock, MD/MPH ?Attending Family Medicine Physician, Faculty Practice ?Center for [facility] ? ?

## 2021-11-04 NOTE — Progress Notes (Signed)
Last OCP pill on 10/22/2021.  ?Stopped bleeding 2-3 days ago.  ?Had Nexplanon placed  in Jan 2023. ?

## 2021-12-09 ENCOUNTER — Ambulatory Visit (INDEPENDENT_AMBULATORY_CARE_PROVIDER_SITE_OTHER): Payer: Medicaid Other

## 2021-12-09 ENCOUNTER — Other Ambulatory Visit (HOSPITAL_COMMUNITY)
Admission: RE | Admit: 2021-12-09 | Discharge: 2021-12-09 | Disposition: A | Discharge status: Home / Self Care | Payer: Medicaid Other | Source: Ambulatory Visit | Attending: Family Medicine | Admitting: Family Medicine

## 2021-12-09 VITALS — BP 100/69 | HR 82 | Wt 150.0 lb

## 2021-12-09 DIAGNOSIS — N898 Other specified noninflammatory disorders of vagina: Secondary | ICD-10-CM | POA: Insufficient documentation

## 2021-12-09 NOTE — Progress Notes (Signed)
Patient here today with the complaint of vaginal itching, increased vaginal discharge and mild abdominal cramping. I instructed patient on how to collect a self swab. Self swab collected without issue. I informed patient we will notify her with any abnormal results. Patient verbalized understanding and denies any other questions.  ? ?Alesia Richards, RN ?12/09/21 ?

## 2021-12-10 LAB — CERVICOVAGINAL ANCILLARY ONLY
Bacterial Vaginitis (gardnerella): NEGATIVE
Candida Glabrata: NEGATIVE
Candida Vaginitis: POSITIVE — AB
Chlamydia: NEGATIVE
Comment: NEGATIVE
Comment: NEGATIVE
Comment: NEGATIVE
Comment: NEGATIVE
Comment: NEGATIVE
Comment: NORMAL
Neisseria Gonorrhea: NEGATIVE
Trichomonas: NEGATIVE

## 2021-12-15 ENCOUNTER — Other Ambulatory Visit: Payer: Self-pay | Admitting: Medical

## 2021-12-15 DIAGNOSIS — B3731 Acute candidiasis of vulva and vagina: Secondary | ICD-10-CM

## 2021-12-15 MED ORDER — FLUCONAZOLE 150 MG PO TABS: 150.0000 mg | ORAL_TABLET | Freq: Every day | ORAL | 0 refills | Status: DC

## 2022-01-13 ENCOUNTER — Telehealth: Payer: Self-pay | Admitting: Family Medicine

## 2022-01-13 NOTE — Telephone Encounter (Signed)
Returned patient call in regards to her back pain. She reports it is in the middle of her back and is calling her a headache and fever. She reports it is in the spot where she got her Epidural.  ? ?She reports she had a fever last night and today. She took Tylenol and that did help.  ? ?The pain comes and go and the pain became very very sharp about 2 days ago. She tried Ibuprofen and Muscle relaxer without relief.  ? ?Reviewed that she needs be seen by Urgent Care or PCP. Patient does not have PCP. Reviewed I will send her a list of PCP's in the area or she can go to an Urgent Care. Patient voiced understanding.  ?

## 2022-01-13 NOTE — Telephone Encounter (Signed)
Patient called, stating she has been having a lot of back pain since the birth of her last baby, she is not sure what to do  ?

## 2022-03-17 ENCOUNTER — Other Ambulatory Visit: Payer: Self-pay

## 2022-03-17 ENCOUNTER — Ambulatory Visit (INDEPENDENT_AMBULATORY_CARE_PROVIDER_SITE_OTHER): Payer: Medicaid Other

## 2022-03-17 ENCOUNTER — Other Ambulatory Visit (HOSPITAL_COMMUNITY)
Admission: RE | Admit: 2022-03-17 | Discharge: 2022-03-17 | Disposition: A | Discharge status: Home / Self Care | Payer: Medicaid Other | Source: Ambulatory Visit | Attending: Family Medicine | Admitting: Family Medicine

## 2022-03-17 VITALS — BP 117/69 | HR 79 | Wt 146.3 lb

## 2022-03-17 DIAGNOSIS — N898 Other specified noninflammatory disorders of vagina: Secondary | ICD-10-CM

## 2022-03-17 DIAGNOSIS — R309 Painful micturition, unspecified: Secondary | ICD-10-CM

## 2022-03-17 LAB — POCT URINALYSIS DIP (DEVICE)
Bilirubin Urine: NEGATIVE
Glucose, UA: NEGATIVE mg/dL
Ketones, ur: NEGATIVE mg/dL
Nitrite: NEGATIVE
Protein, ur: NEGATIVE mg/dL
Specific Gravity, Urine: 1.02 (ref 1.005–1.030)
Urobilinogen, UA: 0.2 mg/dL (ref 0.0–1.0)
pH: 7 (ref 5.0–8.0)

## 2022-03-17 NOTE — Progress Notes (Signed)
Patient is here today with the complaint of vaginal irritation/itching as well as pain with urination and malodorous urine. I instructed patient on how to collect a self swab. Self swab collected without issue. UA completed. Urine culture sent. I explained to patient we will notify her with any abnormal results. Patient verbalized understanding and denies any other questions.  Alesia Richards, RN 03/17/22

## 2022-03-18 LAB — CERVICOVAGINAL ANCILLARY ONLY
Bacterial Vaginitis (gardnerella): NEGATIVE
Candida Glabrata: NEGATIVE
Candida Vaginitis: NEGATIVE
Chlamydia: NEGATIVE
Comment: NEGATIVE
Comment: NEGATIVE
Comment: NEGATIVE
Comment: NEGATIVE
Comment: NEGATIVE
Comment: NORMAL
Neisseria Gonorrhea: NEGATIVE
Trichomonas: NEGATIVE

## 2022-03-19 LAB — URINE CULTURE

## 2022-03-23 ENCOUNTER — Other Ambulatory Visit: Payer: Self-pay | Admitting: Advanced Practice Midwife

## 2022-03-23 DIAGNOSIS — N3000 Acute cystitis without hematuria: Secondary | ICD-10-CM

## 2022-03-23 MED ORDER — SULFAMETHOXAZOLE-TRIMETHOPRIM 800-160 MG PO TABS: 1.0000 | ORAL_TABLET | Freq: Two times a day (BID) | ORAL | 0 refills | Status: AC

## 2022-03-25 ENCOUNTER — Ambulatory Visit (INDEPENDENT_AMBULATORY_CARE_PROVIDER_SITE_OTHER): Payer: Medicaid Other

## 2022-03-25 VITALS — BP 116/74 | HR 90 | Wt 147.0 lb

## 2022-03-25 DIAGNOSIS — R309 Painful micturition, unspecified: Secondary | ICD-10-CM

## 2022-03-25 NOTE — Progress Notes (Signed)
Patient here today with complaint of painful urination. Patient was seen on 03/17/22 for a nurse visit for the same symptom. Urine culture from 03/17/22 came back positive for UTI and antibiotic prescription was sent into patient's pharmacy. This was shared with patient via mychart. I asked the patient if she had taken the antibiotic for treatment. Patient states she did not realize treatment had been sent into her pharmacy so she had not picked it up. Patient states she will go pick up antibiotic prescription from her pharmacy and take it. I encouraged patient to complete the full antibiotic course. Patient verbalized understanding.  Alesia Richards, RN 03/25/22

## 2022-06-16 ENCOUNTER — Ambulatory Visit: Payer: Medicaid Other

## 2022-06-23 ENCOUNTER — Ambulatory Visit: Payer: Medicaid Other

## 2022-07-30 ENCOUNTER — Other Ambulatory Visit: Payer: Self-pay

## 2022-07-30 ENCOUNTER — Other Ambulatory Visit (HOSPITAL_COMMUNITY)
Admission: RE | Admit: 2022-07-30 | Discharge: 2022-07-30 | Disposition: A | Discharge status: Home / Self Care | Payer: Medicaid Other | Source: Ambulatory Visit | Attending: Advanced Practice Midwife | Admitting: Advanced Practice Midwife

## 2022-07-30 ENCOUNTER — Encounter: Payer: Self-pay | Admitting: Advanced Practice Midwife

## 2022-07-30 ENCOUNTER — Ambulatory Visit (INDEPENDENT_AMBULATORY_CARE_PROVIDER_SITE_OTHER): Payer: Medicaid Other | Admitting: Advanced Practice Midwife

## 2022-07-30 VITALS — BP 111/77 | HR 88 | Wt 143.4 lb

## 2022-07-30 DIAGNOSIS — N3000 Acute cystitis without hematuria: Secondary | ICD-10-CM | POA: Diagnosis not present

## 2022-07-30 DIAGNOSIS — R300 Dysuria: Secondary | ICD-10-CM

## 2022-07-30 DIAGNOSIS — N898 Other specified noninflammatory disorders of vagina: Secondary | ICD-10-CM

## 2022-07-30 DIAGNOSIS — Z113 Encounter for screening for infections with a predominantly sexual mode of transmission: Secondary | ICD-10-CM | POA: Insufficient documentation

## 2022-07-30 DIAGNOSIS — B3731 Acute candidiasis of vulva and vagina: Secondary | ICD-10-CM

## 2022-07-31 LAB — HEPATITIS C ANTIBODY: Hep C Virus Ab: NONREACTIVE

## 2022-07-31 LAB — CERVICOVAGINAL ANCILLARY ONLY
Bacterial Vaginitis (gardnerella): NEGATIVE
Candida Glabrata: NEGATIVE
Candida Vaginitis: POSITIVE — AB
Chlamydia: NEGATIVE
Comment: NEGATIVE
Comment: NEGATIVE
Comment: NEGATIVE
Comment: NEGATIVE
Comment: NEGATIVE
Comment: NORMAL
Neisseria Gonorrhea: NEGATIVE
Trichomonas: NEGATIVE

## 2022-07-31 LAB — HEPATITIS B SURFACE ANTIGEN: Hepatitis B Surface Ag: NEGATIVE

## 2022-07-31 LAB — RPR: RPR Ser Ql: NONREACTIVE

## 2022-07-31 LAB — HIV ANTIBODY (ROUTINE TESTING W REFLEX): HIV Screen 4th Generation wRfx: NONREACTIVE

## 2022-08-01 LAB — URINE CULTURE

## 2022-08-03 MED ORDER — FLUCONAZOLE 150 MG PO TABS: 150.0000 mg | ORAL_TABLET | ORAL | 3 refills | Status: AC

## 2022-08-03 MED ORDER — CEFADROXIL 500 MG PO CAPS: 500.0000 mg | ORAL_CAPSULE | Freq: Two times a day (BID) | ORAL | 0 refills | Status: AC

## 2022-08-03 NOTE — Addendum Note (Signed)
Addended by: Dorathy Kinsman on: 08/03/2022 10:16 PM   Modules accepted: Orders

## 2022-08-03 NOTE — Progress Notes (Signed)
   Established Patient Office Visit  Subjective   Patient ID: Kristin Foster, female    DOB: 1995/12/12  Age: 26 y.o. MRN: [id number]  Chief Complaint  Patient presents with   vaginal irritation    Pt reports dysuria, vaginal irritation x for a while. Had similar Sx in July 2023. Dx UTI and VVC. Completed Tx. Requests STI testing.   Last Pap 2022 Nml.  Nexplanon in place since 08/2021.   Past Medical History:  Diagnosis Date   Migraines      Review of Systems  Constitutional:  Negative for chills and fever.  Gastrointestinal:  Negative for abdominal pain.  Genitourinary:  Positive for dysuria. Negative for hematuria.       Neg for vaginal bleeding     Objective:     BP 111/77   Pulse 88   Wt 143 lb 6.4 oz (65 kg)   Breastfeeding No   BMI 27.10 kg/m    Physical Exam Constitutional:      General: She is not in acute distress.    Appearance: Normal appearance. She is not ill-appearing.  HENT:     Head: Normocephalic.  Eyes:     Conjunctiva/sclera: Conjunctivae normal.  Cardiovascular:     Rate and Rhythm: Normal rate.  Pulmonary:     Effort: Pulmonary effort is normal.  Abdominal:     Palpations: Abdomen is soft.     Tenderness: There is no abdominal tenderness.  Genitourinary:    General: Normal vulva.     Labia:        Right: No rash or lesion.        Left: No rash or lesion.      Vagina: Vaginal discharge present. No bleeding.     Cervix: No cervical motion tenderness.  Neurological:     Mental Status: She is alert.      Assessment & Plan:   Problem List Items Addressed This Visit   None Visit Diagnoses     Vaginal discharge    -  Primary   Relevant Orders   Cervicovaginal ancillary only( [place]) (Completed)   Urine Culture (Completed)   RPR (Completed)   Hepatitis B Surface AntiGEN (Completed)   Hepatitis C Antibody (Completed)   HIV antibody (with reflex) (Completed)   Vaginal irritation       Relevant Orders   Cervicovaginal  ancillary only( [place]) (Completed)   Urine Culture (Completed)   RPR (Completed)   Hepatitis B Surface AntiGEN (Completed)   Hepatitis C Antibody (Completed)   HIV antibody (with reflex) (Completed)   Dysuria       Relevant Orders   Cervicovaginal ancillary only( [place]) (Completed)   Urine Culture (Completed)   RPR (Completed)   Hepatitis B Surface AntiGEN (Completed)   Hepatitis C Antibody (Completed)   HIV antibody (with reflex) (Completed)   Routine screening for STI (sexually transmitted infection)       Relevant Orders   Cervicovaginal ancillary only( [place]) (Completed)   Urine Culture (Completed)   RPR (Completed)   Hepatitis B Surface AntiGEN (Completed)   Hepatitis C Antibody (Completed)   HIV antibody (with reflex) (Completed)       Return for 2-3 weeks Gyn follow-up .    Dorathy Kinsman, CNM

## 2022-08-27 ENCOUNTER — Encounter: Payer: Self-pay | Admitting: Obstetrics and Gynecology

## 2022-08-27 ENCOUNTER — Other Ambulatory Visit: Payer: Self-pay

## 2022-08-27 ENCOUNTER — Other Ambulatory Visit (HOSPITAL_COMMUNITY)
Admission: RE | Admit: 2022-08-27 | Discharge: 2022-08-27 | Disposition: A | Discharge status: Home / Self Care | Payer: Medicaid Other | Source: Ambulatory Visit | Attending: Obstetrics and Gynecology | Admitting: Obstetrics and Gynecology

## 2022-08-27 ENCOUNTER — Ambulatory Visit (INDEPENDENT_AMBULATORY_CARE_PROVIDER_SITE_OTHER): Payer: Medicaid Other | Admitting: Obstetrics and Gynecology

## 2022-08-27 VITALS — BP 110/76 | HR 94

## 2022-08-27 DIAGNOSIS — Z1330 Encounter for screening examination for mental health and behavioral disorders, unspecified: Secondary | ICD-10-CM

## 2022-08-27 DIAGNOSIS — N898 Other specified noninflammatory disorders of vagina: Secondary | ICD-10-CM | POA: Insufficient documentation

## 2022-08-27 DIAGNOSIS — Z5189 Encounter for other specified aftercare: Secondary | ICD-10-CM

## 2022-08-27 DIAGNOSIS — T148XXA Other injury of unspecified body region, initial encounter: Secondary | ICD-10-CM

## 2022-08-27 DIAGNOSIS — N946 Dysmenorrhea, unspecified: Secondary | ICD-10-CM | POA: Diagnosis not present

## 2022-08-27 MED ORDER — IBUPROFEN 600 MG PO TABS: 600.0000 mg | ORAL_TABLET | Freq: Four times a day (QID) | ORAL | 5 refills | Status: AC | PRN

## 2022-08-27 NOTE — Progress Notes (Signed)
GYNECOLOGY PATIENT Patient name: Kristin Foster MRN [id number]  Date of birth: 1996-04-14 Chief Complaint:   Follow-up     History:  Kristin Foster is a 26 y.o. [id number] being seen today for vaginitis follow up. States she took medication as instructed for yeast and UTI. Feels itching from inside now on outside as well. Thinks there may be bumps on the outside of her vagina as well. Also has pain with menses for which she takes ibuprofen. Menses lasting up to 10-14 days currently with nexplanon in place. After initial placement did not have menses. Menses are longer now but not heavy. Previously done short course of OCPs with nexplanon in place which helped while taking it.    Also notes marks/bruise on left outer thigh. Has been having bruising on legs only for the last few weeks without clear trauma prior. Notices them when she wakes.    Obstetric History OB History  Gravida Para Term Preterm AB Living  3 2 2  0 1 2  SAB IAB Ectopic Multiple Live Births  1 0 0 0 2    # Outcome Date GA Lbr Len/2nd Weight Sex Delivery Anes PTL Lv  3 Term 08/09/21 [redacted]w[redacted]d  7 lb 2.3 oz (3.24 kg) M CS-LTranv EPI  LIV  2 SAB 2021          1 Term 12/14/15 [redacted]w[redacted]d   M CS-LTranv   LIV     Birth Comments: arrest of second stage, chorio    Past Medical History:  Diagnosis Date   Migraines     Past Surgical History:  Procedure Laterality Date   CESAREAN SECTION     CESAREAN SECTION N/A 08/09/2021   Procedure: CESAREAN SECTION;  Surgeon: 14/05/2021, MD;  Location: [facility] LD ORS;  Service: Obstetrics;  Laterality: N/A;    Current Outpatient Medications on File Prior to Visit  Medication Sig Dispense Refill   cefadroxil (DURICEF) 500 MG capsule Take 1 capsule (500 mg total) by mouth 2 (two) times daily. (Patient not taking: Reported on 08/27/2022) 6 capsule 0   famotidine (PEPCID) 10 MG tablet Take 10 mg by mouth 2 (two) times daily. (Patient not taking: Reported on 03/17/2022)     FENUGREEK PO Take by mouth.  (Patient not taking: Reported on 03/17/2022)     ferrous sulfate 325 (65 FE) MG tablet Take 1 tablet (325 mg total) by mouth every other day. (Patient not taking: Reported on 12/09/2021) 30 tablet 1   fluconazole (DIFLUCAN) 150 MG tablet Take 1 tablet (150 mg total) by mouth every 3 (three) days. Take one tablet. If symptoms are still present after three days, take a second tablet. (Patient not taking: Reported on 08/27/2022) 2 tablet 3   norgestimate-ethinyl estradiol (ORTHO-CYCLEN) 0.25-35 MG-MCG tablet Take 1 tablet by mouth daily. (Patient not taking: Reported on 12/09/2021) 28 tablet 11   Prenatal Vit-Fe Fumarate-FA (PRENATAL VITAMINS PO) Take 1 tablet by mouth daily. (Patient not taking: Reported on 03/17/2022)     sulfamethoxazole-trimethoprim (BACTRIM DS) 800-160 MG tablet Take 1 tablet by mouth 2 (two) times daily. 14 tablet 0   No current facility-administered medications on file prior to visit.    No Known Allergies  Social History:  reports that she has been smoking e-cigarettes. She has never used smokeless tobacco. She reports that she does not drink alcohol and does not use drugs.  Family History  Problem Relation Age of Onset   Hypertension Maternal Grandfather     The following portions of  the patient's history were reviewed and updated as appropriate: allergies, current medications, past family history, past medical history, past social history, past surgical history and problem list.  Review of Systems Pertinent items noted in HPI and remainder of comprehensive ROS otherwise negative.  Physical Exam:  BP 110/76   Pulse 94  Physical Exam Vitals and nursing note reviewed. Exam conducted with a chaperone present.  Constitutional:      Appearance: Normal appearance.  Cardiovascular:     Rate and Rhythm: Normal rate.  Pulmonary:     Effort: Pulmonary effort is normal.     Breath sounds: Normal breath sounds.  Genitourinary:    General: Normal vulva.     Comments:  Butterfly position Discharge noted No allodynia  No abnormal changes of labia bilaterally  Neurological:     General: No focal deficit present.     Mental Status: She is alert and oriented to person, place, and time.  Psychiatric:        Mood and Affect: Mood normal.        Behavior: Behavior normal.        Thought Content: Thought content normal.        Judgment: Judgment normal.      Assessment and Plan:   1. Vaginal itching Repeat swab. Possible unresolved candida infection or new infection following completion of ABX course - Cervicovaginal ancillary only( [place]) - Urine Culture  2. Dysmenorrhea Refill and continue NSAIDs. Offered repeat course of OCPs - will continue to monitor for now  - ibuprofen (ADVIL) 600 MG tablet; Take 1 tablet (600 mg total) by mouth every 6 (six) hours as needed.  Dispense: 60 tablet; Refill: 5  3. Bruising Initial labs collected for bleeding, recommend follow up with PCP - CBC - Protime-INR - APTT  Routine preventative health maintenance measures emphasized. Please refer to After Visit Summary for other counseling recommendations.   Follow-up: No follow-ups on file.      Darliss Cheney, MD Obstetrician & Gynecologist, Faculty Practice [facility] Gynecologic Surgery Center for [employer], [place]

## 2022-08-28 ENCOUNTER — Other Ambulatory Visit: Payer: Self-pay | Admitting: Obstetrics and Gynecology

## 2022-08-28 DIAGNOSIS — L292 Pruritus vulvae: Secondary | ICD-10-CM

## 2022-08-28 LAB — CERVICOVAGINAL ANCILLARY ONLY
Bacterial Vaginitis (gardnerella): NEGATIVE
Candida Glabrata: NEGATIVE
Candida Vaginitis: NEGATIVE
Comment: NEGATIVE
Comment: NEGATIVE
Comment: NEGATIVE

## 2022-08-28 LAB — CBC
Hematocrit: 41.3 % (ref 34.0–46.6)
Hemoglobin: 13.9 g/dL (ref 11.1–15.9)
MCH: 29.3 pg (ref 26.6–33.0)
MCHC: 33.7 g/dL (ref 31.5–35.7)
MCV: 87 fL (ref 79–97)
Platelets: 262 10*3/uL (ref 150–450)
RBC: 4.75 x10E6/uL (ref 3.77–5.28)
RDW: 12.1 % (ref 11.7–15.4)
WBC: 5.5 10*3/uL (ref 3.4–10.8)

## 2022-08-28 LAB — PROTIME-INR
INR: 1 (ref 0.9–1.2)
Prothrombin Time: 10.9 s (ref 9.1–12.0)

## 2022-08-28 LAB — APTT: aPTT: 30 s (ref 24–33)

## 2022-08-28 MED ORDER — TRIAMCINOLONE ACETONIDE 0.025 % EX OINT: 1.0000 | TOPICAL_OINTMENT | Freq: Two times a day (BID) | CUTANEOUS | 0 refills | Status: DC

## 2022-08-29 LAB — URINE CULTURE

## 2022-09-22 ENCOUNTER — Other Ambulatory Visit: Payer: Self-pay

## 2022-09-22 ENCOUNTER — Ambulatory Visit (INDEPENDENT_AMBULATORY_CARE_PROVIDER_SITE_OTHER): Payer: Medicaid Other | Admitting: Obstetrics and Gynecology

## 2022-09-22 VITALS — BP 107/76 | HR 100 | Wt 143.0 lb

## 2022-09-22 DIAGNOSIS — N930 Postcoital and contact bleeding: Secondary | ICD-10-CM | POA: Diagnosis not present

## 2022-09-22 DIAGNOSIS — N921 Excessive and frequent menstruation with irregular cycle: Secondary | ICD-10-CM | POA: Diagnosis not present

## 2022-09-22 DIAGNOSIS — L292 Pruritus vulvae: Secondary | ICD-10-CM | POA: Diagnosis not present

## 2022-09-22 MED ORDER — CLOBETASOL PROPIONATE 0.05 % EX OINT: TOPICAL_OINTMENT | CUTANEOUS | 5 refills | Status: AC

## 2022-09-22 MED ORDER — NORETHINDRONE ACETATE 5 MG PO TABS: 10.0000 mg | ORAL_TABLET | Freq: Every day | ORAL | 2 refills | Status: DC

## 2022-09-22 NOTE — Progress Notes (Signed)
    GYNECOLOGY VISIT  Patient name: Kristin Foster MRN [id number]  Date of birth: [birth year] Chief Complaint:   No chief complaint on file.   History:  Kristin Foster is a 27 y.o. [id number] being seen today for vulvar itching.  Cream didn't help and used it all. Continues to have prolonged bleeding when menses start with nexplanon. Took norethestrone from home [place]) and that has helped with breakthrough bleeding with all forms of contraception. Does not want to switch to anything else as nexplanon is reliable and effective.   Reports feeling vagina feeling "loose" . Had this after last baby. Some loss of urine with stress.   Past Medical History:  Diagnosis Date   Migraines     Past Surgical History:  Procedure Laterality Date   CESAREAN SECTION     CESAREAN SECTION N/A 08/09/2021   Procedure: CESAREAN SECTION;  Surgeon: Chancy Milroy, MD;  Location: [facility];  Service: Obstetrics;  Laterality: N/A;    The following portions of the patient's history were reviewed and updated as appropriate: allergies, current medications, past family history, past medical history, past social history, past surgical history and problem list.    Review of Systems:  Pertinent items are noted in HPI. Comprehensive review of systems was otherwise negative.   Objective:  Physical Exam BP 107/76   Pulse 100   Wt 143 lb (64.9 kg)   LMP 09/10/2022 (Approximate)   BMI 27.02 kg/m    Physical Exam Vitals and nursing note reviewed.  Constitutional:      Appearance: Normal appearance.  HENT:     Head: Normocephalic and atraumatic.  Pulmonary:     Effort: Pulmonary effort is normal.  Skin:    General: Skin is warm and dry.  Neurological:     General: No focal deficit present.     Mental Status: She is alert.  Psychiatric:        Mood and Affect: Mood normal.        Behavior: Behavior normal.        Thought Content: Thought content normal.        Judgment: Judgment normal.         Assessment & Plan:   1. Vulvar itching Unclear etiology, swabs negative. Trial stronger topica steroid.  - clobetasol ointment (TEMOVATE) 0.05 %; Apply to affected area every night for 4 weeks, then every other day for 4 weeks and then twice a week for 4 weeks or until resolution.  Dispense: 30 g; Refill: 5  2. Postcoital bleeding Pelvic US to assess for poss polyp.  - US PELVIC COMPLETE WITH TRANSVAGINAL; Future  3. Breakthrough bleeding Aygestin 10 during bleeding and assess if flow lightened - US PELVIC COMPLETE WITH TRANSVAGINAL; Future - norethindrone (AYGESTIN) 5 MG tablet; Take 2 tablets (10 mg total) by mouth daily.  Dispense: 30 tablet; Refill: 2  Recommend kegels for presumed "looser" sensation of vagina. Suspect changes in pelvic floor following last pregnancy and can start with kegels. If no improvement, will reassess pelvic floor and may need PFPT.   Routine preventative health maintenance measures emphasized.  Darliss Cheney, MD [facility] [place]

## 2022-10-05 ENCOUNTER — Other Ambulatory Visit: Payer: Self-pay | Admitting: Obstetrics and Gynecology

## 2022-10-05 DIAGNOSIS — N921 Excessive and frequent menstruation with irregular cycle: Secondary | ICD-10-CM

## 2022-10-07 ENCOUNTER — Ambulatory Visit
Admission: RE | Admit: 2022-10-07 | Discharge: 2022-10-07 | Disposition: A | Discharge status: Home / Self Care | Payer: Medicaid Other | Source: Ambulatory Visit | Attending: Obstetrics and Gynecology | Admitting: Obstetrics and Gynecology

## 2022-10-07 DIAGNOSIS — N930 Postcoital and contact bleeding: Secondary | ICD-10-CM | POA: Insufficient documentation

## 2022-10-07 DIAGNOSIS — N921 Excessive and frequent menstruation with irregular cycle: Secondary | ICD-10-CM

## 2022-11-12 ENCOUNTER — Ambulatory Visit: Payer: Medicaid Other | Admitting: Obstetrics and Gynecology

## 2023-02-20 ENCOUNTER — Other Ambulatory Visit: Payer: Self-pay | Admitting: Obstetrics and Gynecology

## 2023-02-20 DIAGNOSIS — L292 Pruritus vulvae: Secondary | ICD-10-CM

## 2023-03-08 ENCOUNTER — Other Ambulatory Visit: Payer: Self-pay | Admitting: Advanced Practice Midwife

## 2023-03-08 DIAGNOSIS — N3000 Acute cystitis without hematuria: Secondary | ICD-10-CM

## 2023-03-09 DIAGNOSIS — N898 Other specified noninflammatory disorders of vagina: Secondary | ICD-10-CM | POA: Diagnosis not present

## 2023-03-09 DIAGNOSIS — Z113 Encounter for screening for infections with a predominantly sexual mode of transmission: Secondary | ICD-10-CM | POA: Diagnosis not present

## 2023-03-09 DIAGNOSIS — R300 Dysuria: Secondary | ICD-10-CM | POA: Diagnosis not present

## 2023-03-09 DIAGNOSIS — B3731 Acute candidiasis of vulva and vagina: Secondary | ICD-10-CM | POA: Diagnosis not present

## 2023-03-10 ENCOUNTER — Ambulatory Visit: Payer: Medicaid Other

## 2023-06-27 IMAGING — US US MFM OB FOLLOW-UP
1 series · 14 of 28 positions shown · non-contrast
Comparison: none

[Series 1: us mfm ob follow-up · 42 acquisitions, 14 frames shown]
[im 2/42]
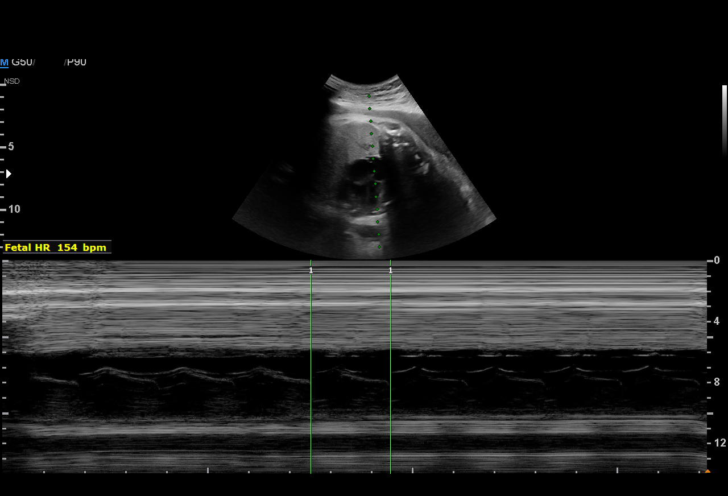
[im 5/42]
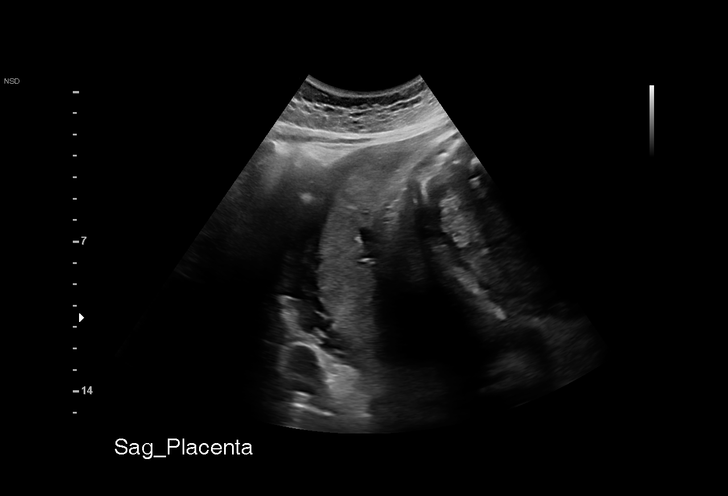
[im 8/42]
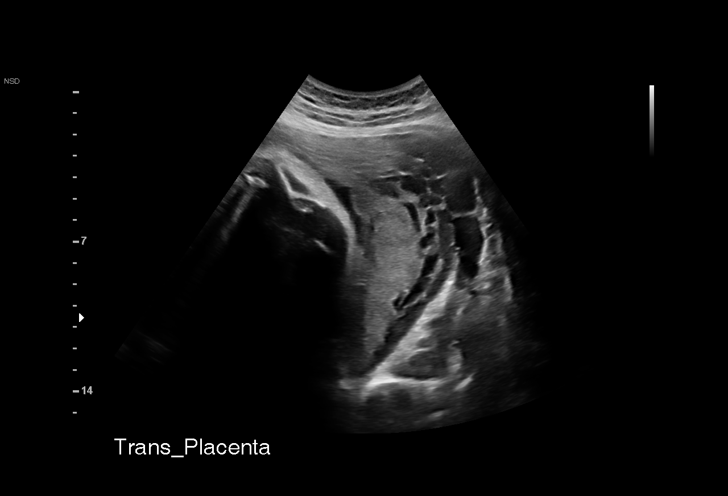
[im 11/42]
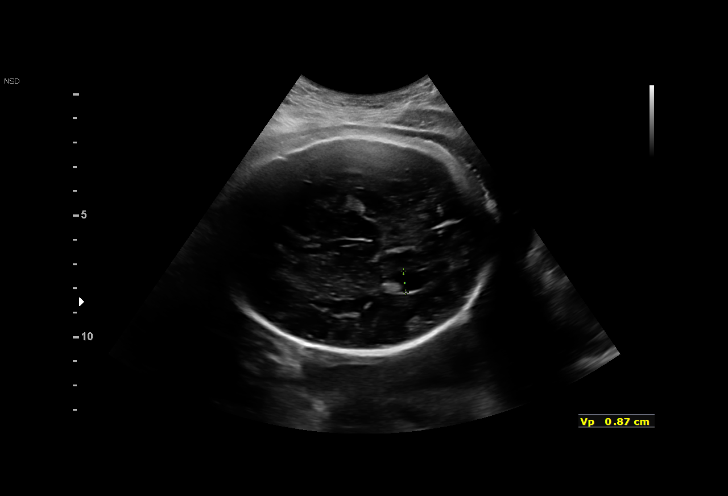
[im 14/42]
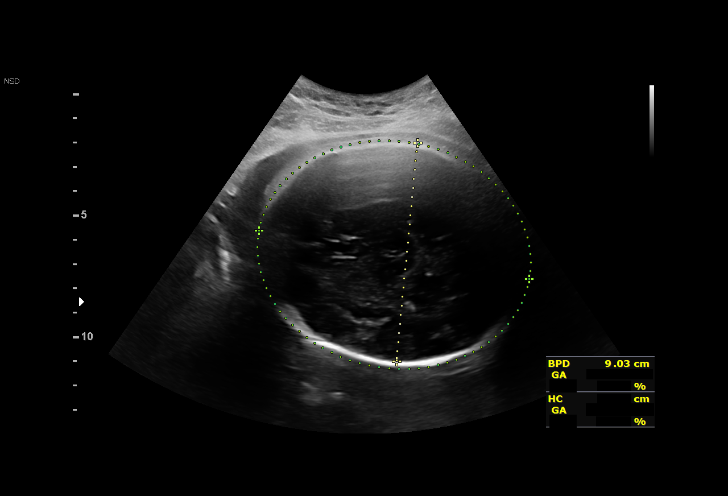
[im 17/42]
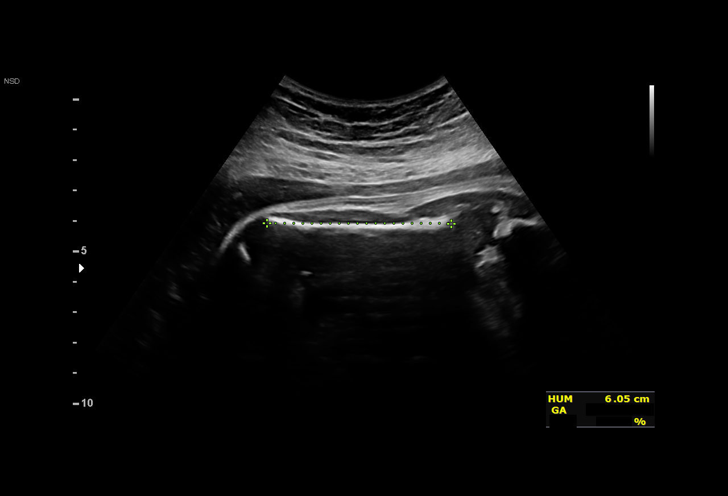
[im 20/42]
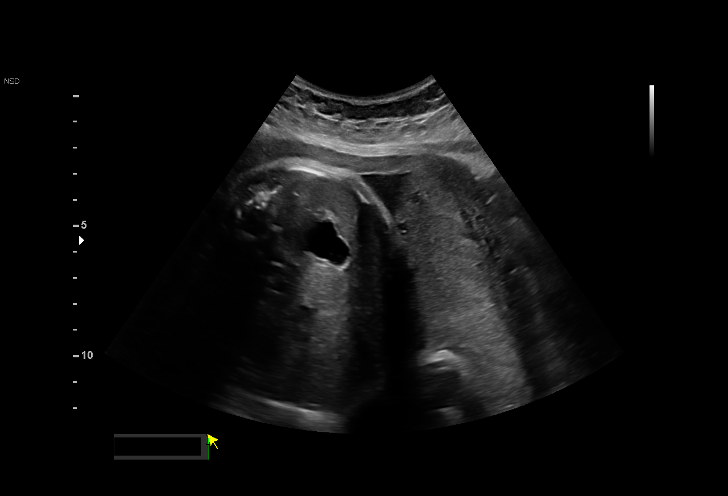
[im 23/42]
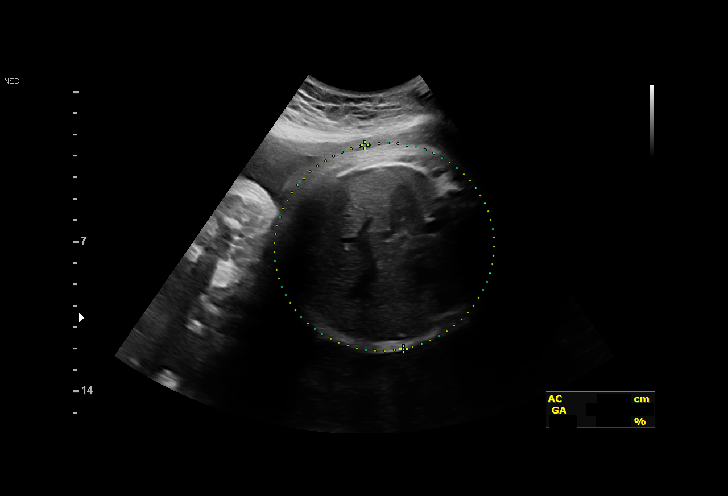
[im 26/42]
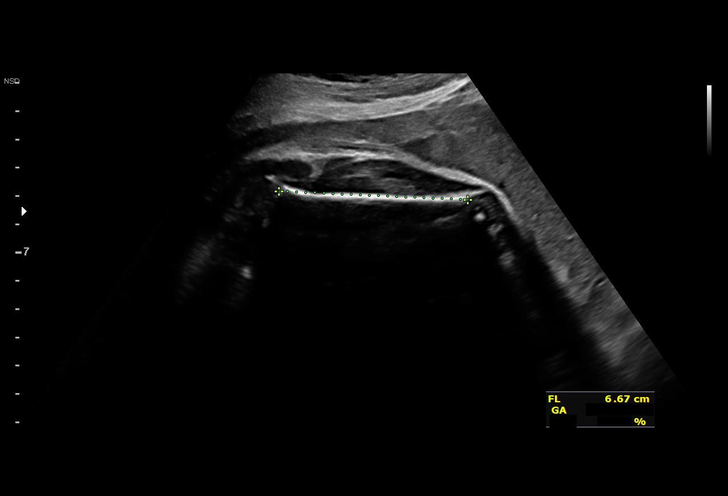
[im 29/42]
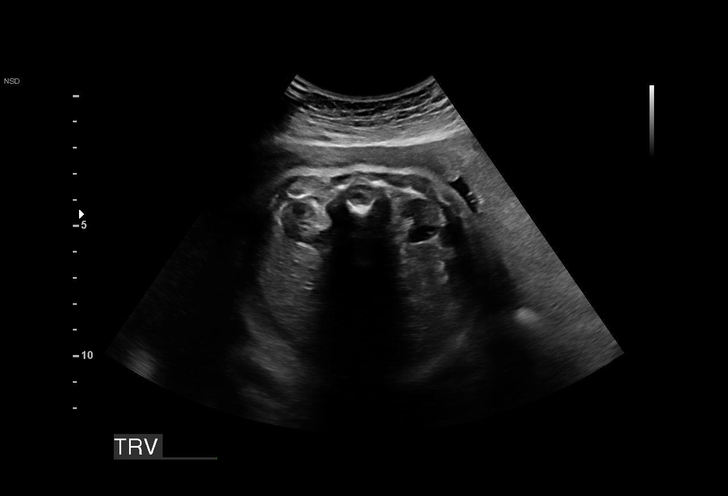
[im 32/42]
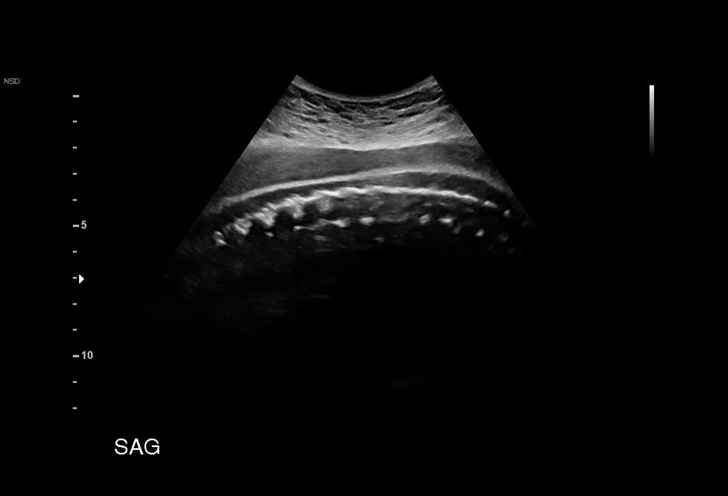
[im 35/42]
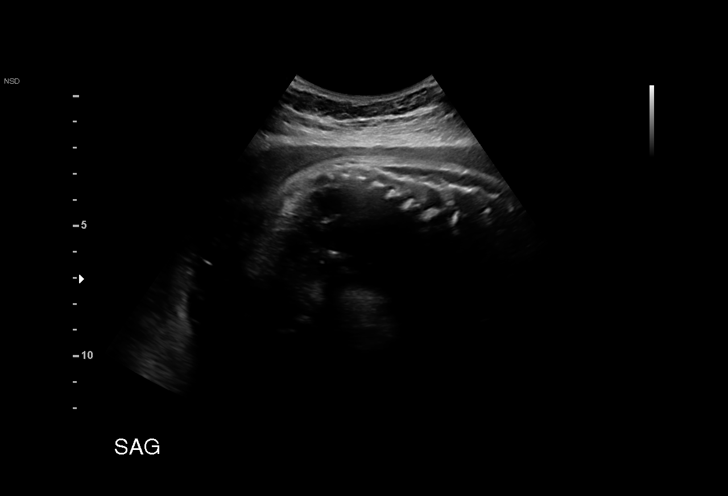
[im 38/42]
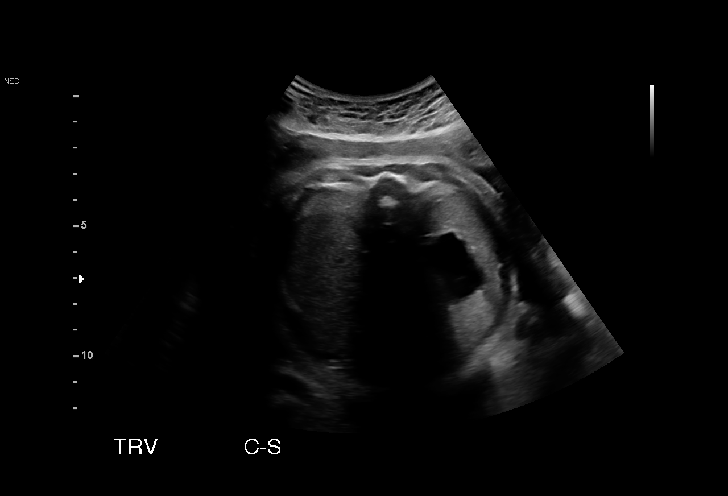
[im 42/42]
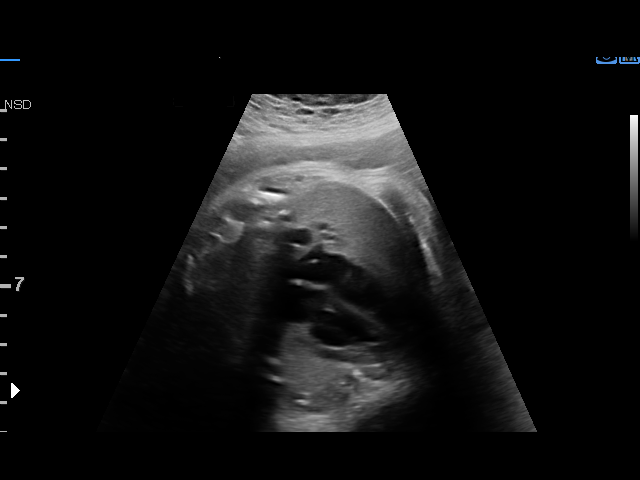

[14 of 28 positions shown; findings below may reference images not displayed]

Indications

 History of cesarean delivery, currently
 pregnant (Repeat scheduled for 08-01-21)
 36 weeks gestation of pregnancy
 Insufficient Prenatal Care
 Poor obstetrical history (possible history of
 full term SGA infant)
 Encounter for antenatal screening for
 malformations
Fetal Evaluation

 Num Of Fetuses:         1
 Fetal Heart Rate(bpm):  154
 Cardiac Activity:       Observed
 Presentation:           Cephalic
 Placenta:               Posterior
 P. Cord Insertion:      Not well visualized

 Amniotic Fluid
 AFI FV:      Within normal limits

 AFI Sum(cm)     %Tile       Largest Pocket(cm)
 19.5            73          6.

 RUQ(cm)       RLQ(cm)       LUQ(cm)        LLQ(cm)
 4.3           5.2           6              4
Biometry
 BPD:      90.2  mm     G. Age:  36w 4d         73  %    CI:        76.55   %    70 - 86
                                                         FL/HC:      20.4   %    20.1 -
 HC:      326.6  mm     G. Age:  37w 0d         45  %    HC/AC:      1.04        0.93 -
 AC:      314.9  mm     G. Age:  35w 3d         43  %    FL/BPD:     73.7   %    71 - 87
 FL:       66.5  mm     G. Age:  34w 2d          9  %    FL/AC:      21.1   %    20 - 24
 HUM:      60.7  mm     G. Age:  35w 1d         47  %
 LV:        8.7  mm

 Est. FW:    9333  gm    5 lb 14 oz      34  %
OB History

 Blood Type:   O+
 Gravidity:    3         Term:   1        Prem:   0        SAB:   1
 TOP:          0       Ectopic:  0        Living: 1
Gestational Age

 LMP:           36w 0d        Date:  10/30/20                 EDD:   08/06/21
 U/S Today:     35w 6d                                        EDD:   08/07/21
 Best:          36w 0d     Det. By:  LMP  (10/30/20)          EDD:   08/06/21
Anatomy

 Cranium:               Appears normal         LVOT:                   Previously seen
 Cavum:                 Previously seen        Aortic Arch:            Not well visualized
 Ventricles:            Appears normal         Ductal Arch:            Previously seen
 Choroid Plexus:        Previously seen        Diaphragm:              Appears normal
 Cerebellum:            Previously seen        Stomach:                Appears normal, left
                                                                       sided
 Posterior Fossa:       Previously seen        Abdomen:                Appears normal
 Nuchal Fold:           Not applicable (>20    Abdominal Wall:         Previously seen
                        wks GA)
 Face:                  Orbits and profile     Cord Vessels:           Previously seen
                        previously seen
 Lips:                  Previously seen        Kidneys:                Appear normal
 Palate:                Not well visualized    Bladder:                Appears normal
 Thoracic:              Appears normal         Spine:                  Limited views
                                                                       appear normal
 Heart:                 Appears normal         Upper Extremities:      Visualized
                        (4CH, axis, and
                        situs)
 RVOT:                  Previously seen        Lower Extremities:      Visualized

 Other:  Male gender previously seen. 3VV and 3VTV previously visualized.
         Technically difficult due to advanced GA and fetal position.
Cervix Uterus Adnexa

 Cervix
 Not visualized (advanced GA >39wks)
Impression

 Follow up growth to complete the fetal anatomy
 Normal interval growth with measurements consistent with
 dates
 Good fetal movement and amniotic fluid volume
 The anatomy was not completed today.
Recommendations

 Follow up as clinically indicated.

## 2023-07-12 NOTE — Telephone Encounter (Signed)
Error

## 2023-08-08 IMAGING — CT CT ABD-PELV W/ CM
2 of 4 series · 14 of 46 positions shown, 16 images · IV contrast (Omni 300)
Comparison: None.

CLINICAL DATA: Abdominal pain. Status post Caesarean section
08/09/2021.

EXAM:
CT ABDOMEN AND PELVIS WITH CONTRAST
TECHNIQUE: Multidetector CT imaging of the abdomen and pelvis was performed
using the standard protocol following bolus administration of
intravenous contrast.
CONTRAST:  100mL OMNIPAQUE IOHEXOL 300 MG/ML  SOLN

[Series 3: a/p w/ 5mm · axial · 0.98mm/px · z∈[+612,+982]mm · 11 of 90 slices shown, 13 images]
[im 8/90  soft-tissue]
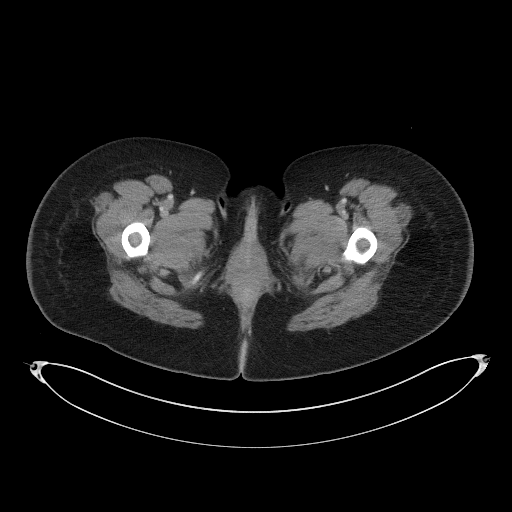
[im 8/90  bone]
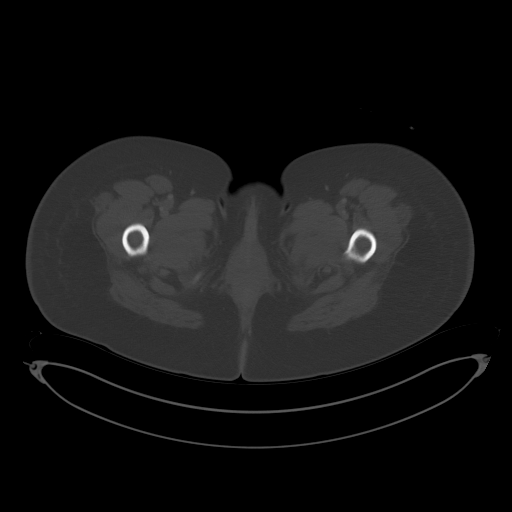
[im 15/90  soft-tissue]
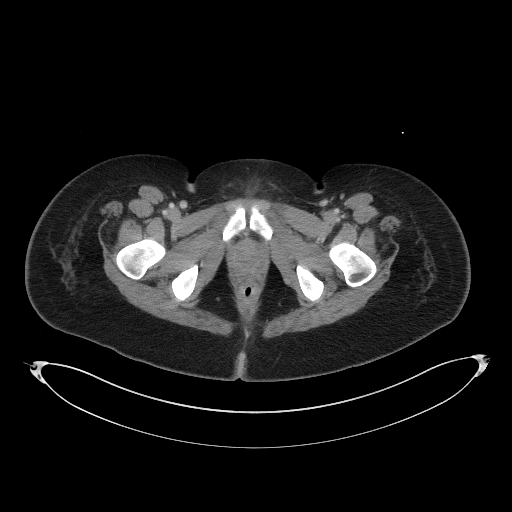
[im 23/90  soft-tissue]
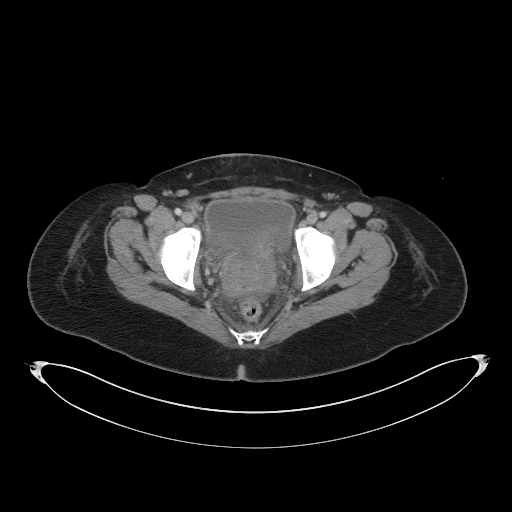
[im 30/90  soft-tissue]
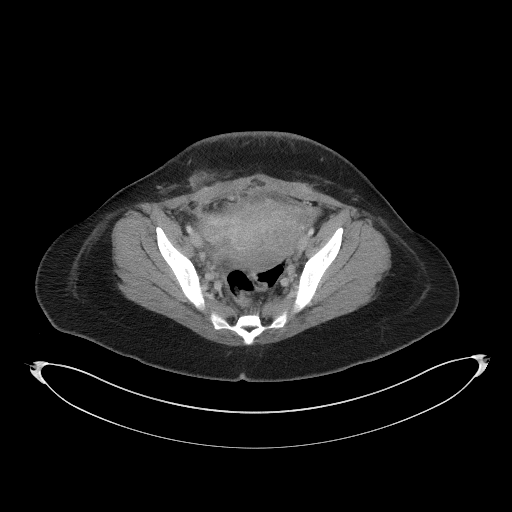
[im 38/90  soft-tissue]
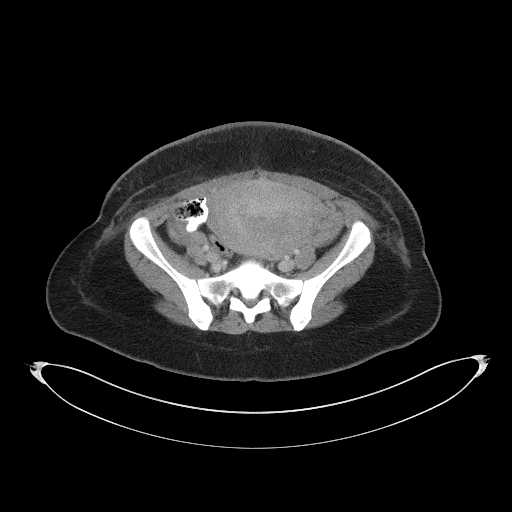
[im 45/90  soft-tissue]
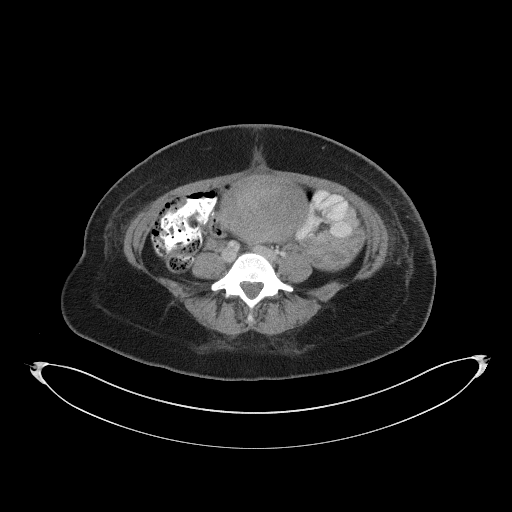
[im 52/90  soft-tissue]
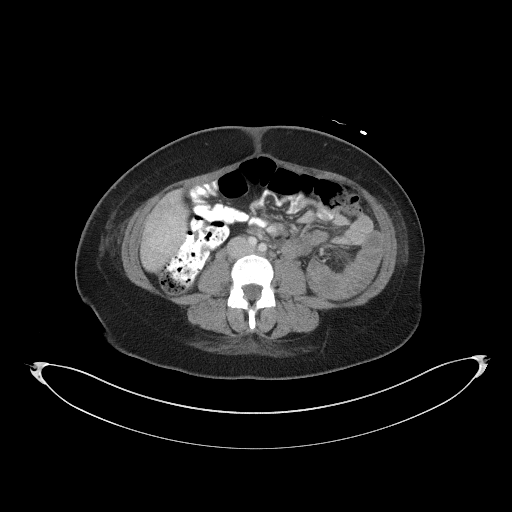
[im 60/90  soft-tissue]
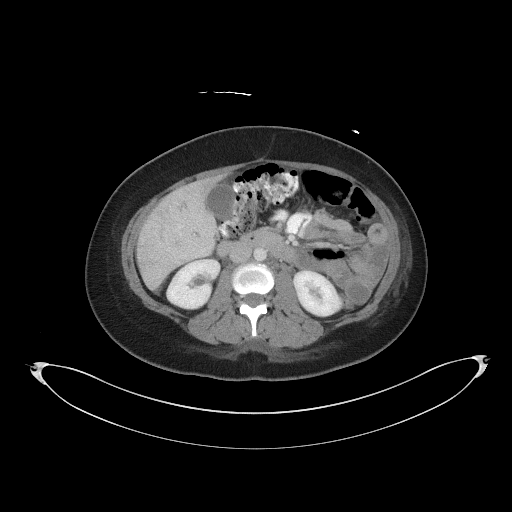
[im 67/90  soft-tissue]
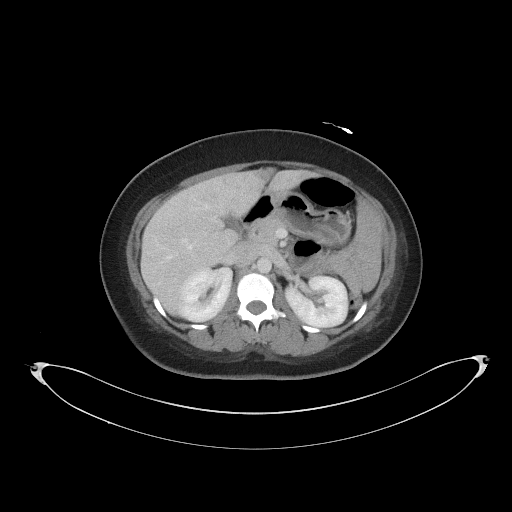
[im 67/90  bone]
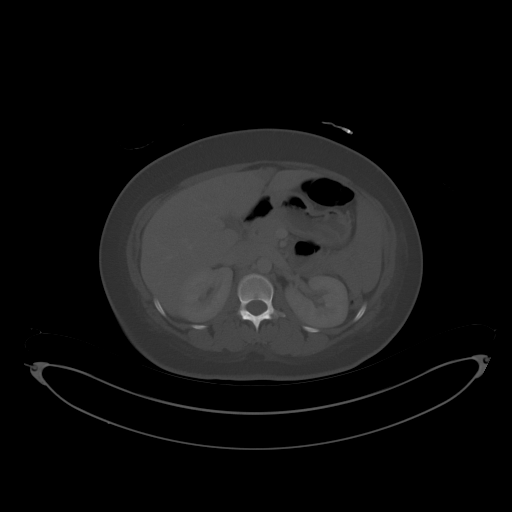
[im 75/90  soft-tissue]
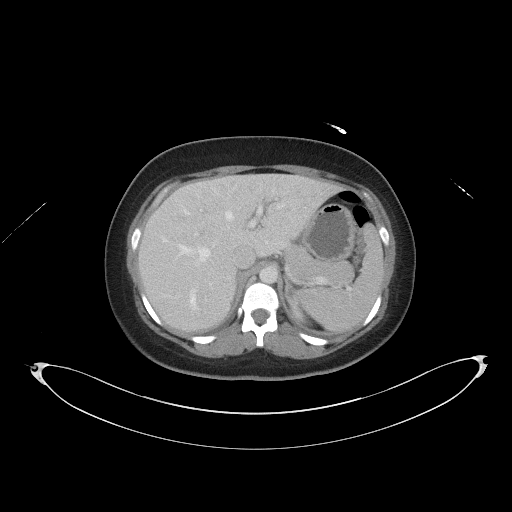
[im 82/90  soft-tissue]
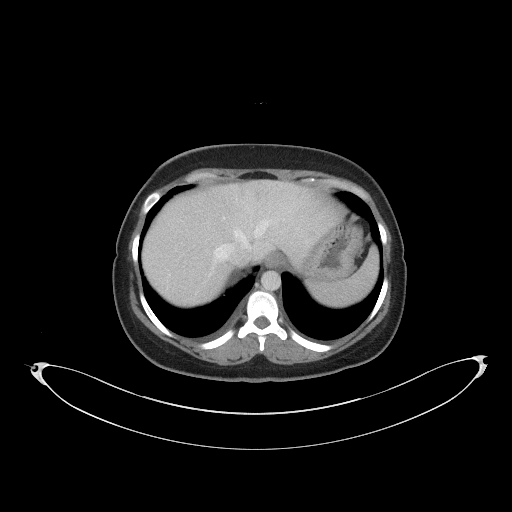

[Series 6: a/p w/ cor · coronal · 0.87mm/px · 3 of 147 slices shown]
[im 49/147  soft-tissue]
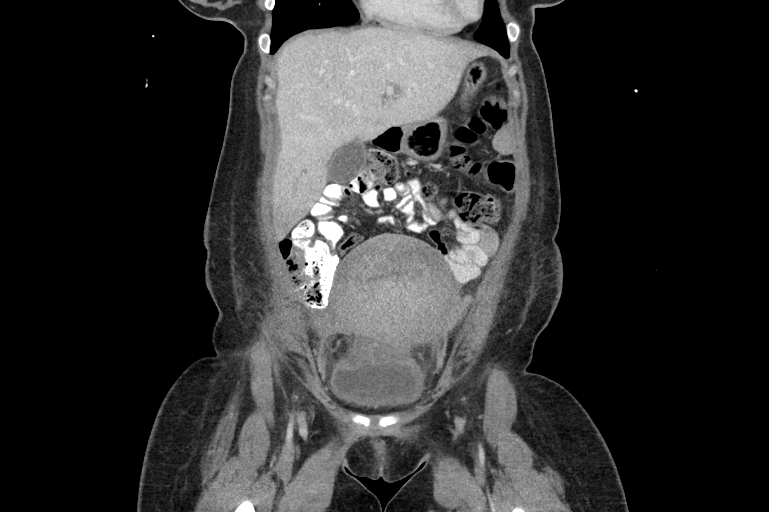
[im 65/147  soft-tissue]
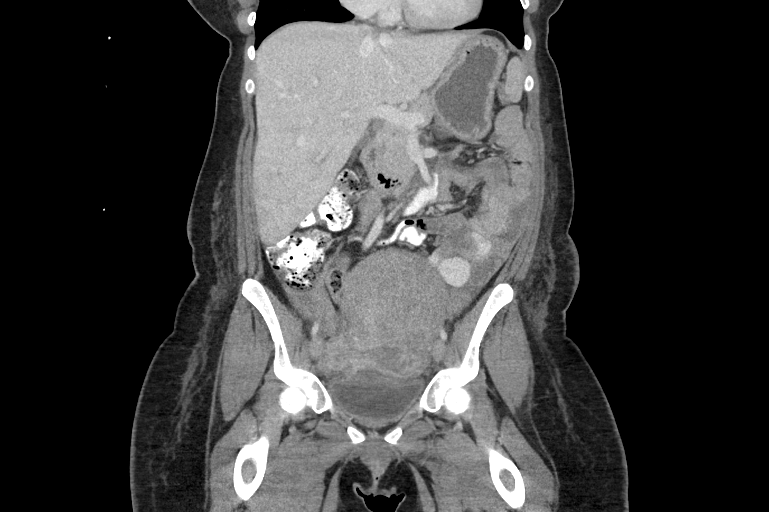
[im 82/147  soft-tissue]
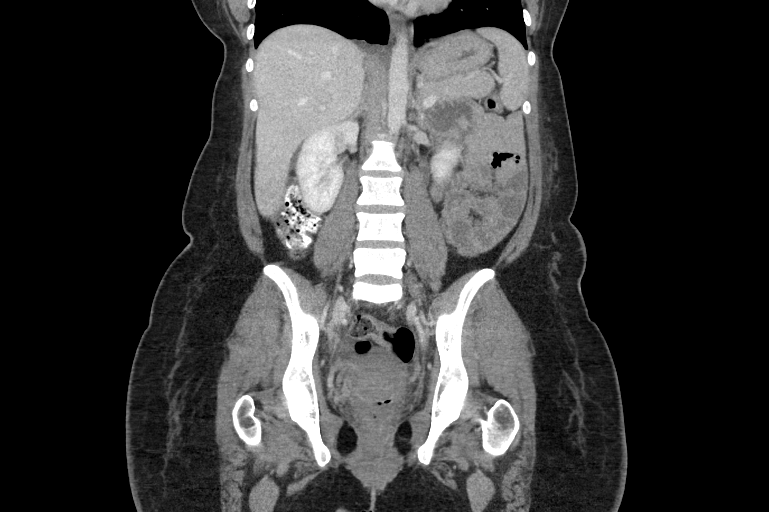

[14 of 46 positions shown; findings below may reference images not displayed]

FINDINGS: Lower chest: Tiny bilateral pleural effusions.

Hepatobiliary: No suspicious focal abnormality within the liver
parenchyma. Small area of low attenuation in the anterior liver,
adjacent to the falciform ligament, is in a characteristic location
for focal fatty deposition. Trace periportal edema evident.
Gallbladder is distended with some trace pericholecystic fluid. No
calcified gallstones by CT imaging. No intrahepatic or extrahepatic
biliary dilation.

Pancreas: No focal mass lesion. No dilatation of the main duct. No
intraparenchymal cyst. No peripancreatic edema.

Spleen: No splenomegaly. No focal mass lesion.

Adrenals/Urinary Tract: No adrenal nodule or mass. Kidneys
unremarkable. No evidence for hydroureter. The urinary bladder
appears normal for the degree of distention.

Stomach/Bowel: Stomach is unremarkable. No gastric wall thickening.
No evidence of outlet obstruction. Duodenum is normally positioned
as is the ligament of Treitz. No small bowel wall thickening. No
small bowel dilatation. The terminal ileum is normal. The appendix
is normal. No gross colonic mass. No colonic wall thickening.

Vascular/Lymphatic: No abdominal aortic aneurysm. No abdominal
aortic atherosclerotic calcification. There is no gastrohepatic or
hepatoduodenal ligament lymphadenopathy. No retroperitoneal or
mesenteric lymphadenopathy. No pelvic sidewall lymphadenopathy.

Reproductive: Enlarged uterus compatible with postpartum state.
x 2.8 x 3.6 cm collection is identified in the anterior lower
uterine segment at the site of C-section, extending anteriorly into
the space between the bladder and anterior uterus. CT imaging
features compatible with bladder flap hematoma. Given timing in the
peripheral and irregular internal enhancement, superinfection is a
distinct concern.

There is no adnexal mass.

Other: There is trace free fluid in the cul-de-sac and around the
liver.

Musculoskeletal: 2.1 x 1.2 x 2.4 cm irregular rim enhancing
collection is identified in the low rectus sheath, just cranial to
the level of the low transverse incision.
IMPRESSION: 1. 7.2 x 2.8 x 3.6 cm collection in the anterior lower uterine
segment at the site of C-section, extending anteriorly into the
space between the bladder and anterior uterus. CT imaging features
compatible with bladder flap hematoma. Given timing and the
irregular internal and peripheral enhancement, superinfection is a
distinct concern.
2. 2.1 x 1.2 x 2.4 cm irregular rim enhancing collection in the low
rectus sheath, just cranial to the level of the low transverse
incision. Small abscess not excluded.
3. Trace free fluid in the cul-de-sac and around the liver.
4. Tiny bilateral pleural effusions.
